# Patient Record
Sex: Female | Born: 1995 | Race: White | Hispanic: No | Marital: Single | Smoking: Never smoker
Health system: Southern US, Community
[De-identification: ages and names within clinical notes are randomized; demographics above are authoritative.]

## PROBLEM LIST (undated history)

## (undated) DIAGNOSIS — M2550 Pain in unspecified joint: Secondary | ICD-10-CM

## (undated) DIAGNOSIS — T7840XA Allergy, unspecified, initial encounter: Secondary | ICD-10-CM

## (undated) DIAGNOSIS — M797 Fibromyalgia: Secondary | ICD-10-CM

## (undated) DIAGNOSIS — F419 Anxiety disorder, unspecified: Secondary | ICD-10-CM

## (undated) DIAGNOSIS — E538 Deficiency of other specified B group vitamins: Secondary | ICD-10-CM

## (undated) DIAGNOSIS — F32A Depression, unspecified: Secondary | ICD-10-CM

## (undated) DIAGNOSIS — K5900 Constipation, unspecified: Secondary | ICD-10-CM

## (undated) DIAGNOSIS — F909 Attention-deficit hyperactivity disorder, unspecified type: Secondary | ICD-10-CM

## (undated) DIAGNOSIS — F329 Major depressive disorder, single episode, unspecified: Secondary | ICD-10-CM

## (undated) DIAGNOSIS — F3181 Bipolar II disorder: Secondary | ICD-10-CM

## (undated) DIAGNOSIS — F4310 Post-traumatic stress disorder, unspecified: Secondary | ICD-10-CM

## (undated) DIAGNOSIS — M549 Dorsalgia, unspecified: Secondary | ICD-10-CM

## (undated) DIAGNOSIS — E559 Vitamin D deficiency, unspecified: Secondary | ICD-10-CM

## (undated) DIAGNOSIS — E282 Polycystic ovarian syndrome: Secondary | ICD-10-CM

## (undated) DIAGNOSIS — R7303 Prediabetes: Secondary | ICD-10-CM

## (undated) DIAGNOSIS — F509 Eating disorder, unspecified: Secondary | ICD-10-CM

## (undated) DIAGNOSIS — R12 Heartburn: Secondary | ICD-10-CM

## (undated) DIAGNOSIS — F429 Obsessive-compulsive disorder, unspecified: Secondary | ICD-10-CM

## (undated) HISTORY — DX: Deficiency of other specified B group vitamins: E53.8

## (undated) HISTORY — DX: Anxiety disorder, unspecified: F41.9

## (undated) HISTORY — DX: Heartburn: R12

## (undated) HISTORY — PX: FRACTURE SURGERY: SHX138

## (undated) HISTORY — DX: Bipolar II disorder: F31.81

## (undated) HISTORY — DX: Obsessive-compulsive disorder, unspecified: F42.9

## (undated) HISTORY — DX: Allergy, unspecified, initial encounter: T78.40XA

## (undated) HISTORY — DX: Fibromyalgia: M79.7

## (undated) HISTORY — DX: Prediabetes: R73.03

## (undated) HISTORY — DX: Eating disorder, unspecified: F50.9

## (undated) HISTORY — DX: Polycystic ovarian syndrome: E28.2

## (undated) HISTORY — DX: Dorsalgia, unspecified: M54.9

## (undated) HISTORY — DX: Vitamin D deficiency, unspecified: E55.9

## (undated) HISTORY — DX: Major depressive disorder, single episode, unspecified: F32.9

## (undated) HISTORY — DX: Pain in unspecified joint: M25.50

## (undated) HISTORY — DX: Attention-deficit hyperactivity disorder, unspecified type: F90.9

## (undated) HISTORY — DX: Constipation, unspecified: K59.00

## (undated) HISTORY — DX: Depression, unspecified: F32.A

## (undated) HISTORY — DX: Post-traumatic stress disorder, unspecified: F43.10

---
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
## 2017-06-28 ENCOUNTER — Telehealth: Payer: Self-pay | Admitting: Certified Nurse Midwife

## 2017-06-28 NOTE — Telephone Encounter (Signed)
Left message to call Janet Palmer at [phone].  

## 2017-06-28 NOTE — Telephone Encounter (Signed)
Patient mother requesting to speak to a nurse about her daughter's irregular periods.

## 2017-06-29 NOTE — Telephone Encounter (Signed)
Spoke with patients mother, "Raynelle Fanning", ok per current dpr. States daughter has only had one cycle since last OV 11/03/16, thinks it was May or June, that was with the provera. Takes 100 mg Lamictal daily, unsure if this may be affecting cycles. Daughter is now away for college, will return next week. Denies pain or any other gyn concerns.   Recommended OV for further evaluation, scheduled for 10/9 at 11am. Advised mother will review with Leota Sauers, CNM and return call with any additional recommendations, mom is agreeable.  Routing to provider for final review.  Will close encounter.

## 2017-06-29 NOTE — Telephone Encounter (Signed)
agree

## 2017-07-06 ENCOUNTER — Encounter: Payer: Self-pay | Admitting: Certified Nurse Midwife

## 2017-07-06 ENCOUNTER — Ambulatory Visit (INDEPENDENT_AMBULATORY_CARE_PROVIDER_SITE_OTHER): Payer: Managed Care, Other (non HMO) | Admitting: Certified Nurse Midwife

## 2017-07-06 VITALS — BP 104/62 | HR 60 | Resp 16 | Ht 63.25 in | Wt 175.0 lb

## 2017-07-06 DIAGNOSIS — Z01419 Encounter for gynecological examination (general) (routine) without abnormal findings: Secondary | ICD-10-CM | POA: Diagnosis not present

## 2017-07-06 DIAGNOSIS — N912 Amenorrhea, unspecified: Secondary | ICD-10-CM

## 2017-07-06 DIAGNOSIS — N926 Irregular menstruation, unspecified: Secondary | ICD-10-CM | POA: Diagnosis not present

## 2017-07-06 NOTE — Progress Notes (Addendum)
Subjective:     Patient ID: Janet Palmer, female   DOB: May 25, 1996, 21 y.o.   MRN: [id number]  Patient complaining of amenorrhea for the past 3 months, possibly last period 6/18 or 7/18. Last year has been same with periods every 2-3 months. One use of Provera and started period after 3 days of use. Feels like period is going to start now with some breast tenderness. Considering OCP for cycle regulation. Sexually active with female only with oral sexually active. No insertion of any device in vagina. History of vagisiimis.Would like to be able to use tampons at some point. Patient has had amenorrhea history off and on for the past two years. Denies any other health issues. On break from [facility], but going back today.     Review of Systems  Constitutional: Negative.   Gastrointestinal: Negative.   Genitourinary: Negative.   Psychiatric/Behavioral: Negative.        Objective:   Physical Exam  Constitutional: She is oriented to person, place, and time. She appears well-developed and well-nourished.  Cardiovascular: Normal rate and regular rhythm.   Pulmonary/Chest: Effort normal.  Breast normal, no masses or nipple discharge  Abdominal: She exhibits no mass. There is no tenderness. There is no guarding.  Genitourinary: Vagina normal and uterus normal. There is no rash, tenderness, lesion or injury on the right labia. There is no rash, tenderness, lesion or injury on the left labia. Cervix exhibits no motion tenderness. Right adnexum displays no mass, no tenderness and no fullness. Left adnexum displays no mass, no tenderness and no fullness.  Genitourinary Comments: Cervical palpation only no visualization due to history of vagisimis.  Tolerated exam well with CMA in attendance. Applied 1 % Lidocaine with Q tip in vaginal opening with good response.  Lymphadenopathy:       Right: No inguinal adenopathy present.       Left: No inguinal adenopathy present.  Neurological: She is alert and  oriented to person, place, and time.  Skin: Skin is warm and dry.  Psychiatric: She has a normal mood and affect. Her behavior is normal. Judgment and thought content normal.        Assessment:     Normal pelvic exam History of Amenorrhea, desires cycle control Not sexually active (oral only) History of vagisimis tolerated exam well Bipolar, Anxiety, OCD on stable medication      Plan:     Discussed finding of normal pelvic exam. Discussed with normal exam, lab evaluation warranted to make sure no pituitary, thyroid or hormone change that is causing the amenorrhea as before. Patient agreeable. If no period by the time labs are in and period starts she could start on OCP to see if this will help with cycle control. Risks and benefits/warning signs, side effects reviewed and expectations with cycle. Patient would like to try. Will advise if Provera challenge used again when bleeding starts so she can start her OCP. Would need 3 month evaluation once she has been on them. Agreeable. No Rx given at this time. Lab: TSH,FSH,Prolactin, HCG Qualitative Discussed progressive treatment option for vagismis available if patient interested. Declines at this time. Aware of STD prevention even with oral sexual activity only  Rv prn and as above.

## 2017-07-06 NOTE — Patient Instructions (Addendum)
Primary Amenorrhea Primary amenorrhea is the absence of any menstrual flow in a female by the age of 15 years. An average age for the start of menstruation is the age of 12 years. Primary amenorrhea is not considered to have occurred until a female is older than 15 years and has never menstruated. This may occur with or without other signs of puberty. What are the causes? Some common causes of not menstruating include:  Chromosomal abnormality causing the ovaries to malfunction is the most common cause of primary amenorrhea.  Malnutrition.  Low blood sugar (hypoglycemia).  Polycystic ovary syndrome (cysts in the ovaries, not ovulating).  Absence of the vagina, uterus, or ovaries since birth (congenital).  Extreme obesity.  Cystic fibrosis.  Drastic weight loss from any cause.  Over-exercising (running, biking) causing loss of body fat.  Pituitary gland tumor in the brain.  Long-term (chronic) illnesses.  Cushing disease.  Thyroid disease (hypothyroidism, hyperthyroidism).  Part of the brain (hypothalamus) not functioning normally.  Premature ovarian failure.  What are the signs or symptoms? No menstruation by age 68 years in normally developed females is the primary symptom. Other symptoms may include:  Discharge from the breasts.  Hot flashes.  Adult acne.  Facial or chest hair.  Headaches.  Impaired vision.  Recent stress.  Changes in weight, diet, or exercise patterns.  How is this diagnosed? Primary amenorrhea is diagnosed with the help of a medical history and a physical exam. Other tests that may be recommended include:  Blood tests to check for pregnancy, hormonal changes, a bleeding or thyroid disorder, low iron levels (anemia), or other problems.  Urine tests.  Specialized X-ray exams.  How is this treated? Treatment will depend on the cause. For example, some of the causes of primary amenorrhea, such as congenital absence of sex organs, will  require surgery to correct. Others may respond to treatment with medicine. Contact a health care provider if:  There has not been any menstrual flow by age 40 years.  Body maturation does not occur at a level typical of peers.  Pelvic area pain occurs.  There is unusual weight gain or hair growth. This information is not intended to replace advice given to you by your health care provider. Make sure you discuss any questions you have with your health care provider. Document Released: 09/14/2005 Document Revised: 02/20/2016 Document Reviewed: 04/26/2013 Elsevier Interactive Patient Education  2018 [employer].  Oral Contraception Use Oral contraceptive pills (OCPs) are medicines taken to prevent pregnancy. OCPs work by preventing the ovaries from releasing eggs. The hormones in OCPs also cause the cervical mucus to thicken, preventing the sperm from entering the uterus. The hormones also cause the uterine lining to become thin, not allowing a fertilized egg to attach to the inside of the uterus. OCPs are highly effective when taken exactly as prescribed. However, OCPs do not prevent sexually transmitted diseases (STDs). Safe sex practices, such as using condoms along with an OCP, can help prevent STDs. Before taking OCPs, you may have a physical exam and Pap test. Your health care provider may also order blood tests if necessary. Your health care provider will make sure you are a good candidate for oral contraception. Discuss with your health care provider the possible side effects of the OCP you may be prescribed. When starting an OCP, it can take 2 to 3 months for the body to adjust to the changes in hormone levels in your body. How to take oral contraceptive pills Your health  care provider may advise you on how to start taking the first cycle of OCPs. Otherwise, you can:  Start on day 1 of your menstrual period. You will not need any backup contraceptive protection with this start  time.  Start on the first Sunday after your menstrual period or the day you get your prescription. In these cases, you will need to use backup contraceptive protection for the first week.  Start the pill at any time of your cycle. If you take the pill within 5 days of the start of your period, you are protected against pregnancy right away. In this case, you will not need a backup form of birth control. If you start at any other time of your menstrual cycle, you will need to use another form of birth control for 7 days. If your OCP is the type called a minipill, it will protect you from pregnancy after taking it for 2 days (48 hours).  After you have started taking OCPs:  If you forget to take 1 pill, take it as soon as you remember. Take the next pill at the regular time.  If you miss 2 or more pills, call your health care provider because different pills have different instructions for missed doses. Use backup birth control until your next menstrual period starts.  If you use a 28-day pack that contains inactive pills and you miss 1 of the last 7 pills (pills with no hormones), it will not matter. Throw away the rest of the non-hormone pills and start a new pill pack.  No matter which day you start the OCP, you will always start a new pack on that same day of the week. Have an extra pack of OCPs and a backup contraceptive method available in case you miss some pills or lose your OCP pack. Follow these instructions at home:  Do not smoke.  Always use a condom to protect against STDs. OCPs do not protect against STDs.  Use a calendar to mark your menstrual period days.  Read the information and directions that came with your OCP. Talk to your health care provider if you have questions. Contact a health care provider if:  You develop nausea and vomiting.  You have abnormal vaginal discharge or bleeding.  You develop a rash.  You miss your menstrual period.  You are losing your  hair.  You need treatment for mood swings or depression.  You get dizzy when taking the OCP.  You develop acne from taking the OCP.  You become pregnant. Get help right away if:  You develop chest pain.  You develop shortness of breath.  You have an uncontrolled or severe headache.  You develop numbness or slurred speech.  You develop visual problems.  You develop pain, redness, and swelling in the legs. This information is not intended to replace advice given to you by your health care provider. Make sure you discuss any questions you have with your health care provider. Document Released: 09/03/2011 Document Revised: 02/20/2016 Document Reviewed: 03/05/2013 Elsevier Interactive Patient Education  2017 [employer].

## 2017-07-07 LAB — FOLLICLE STIMULATING HORMONE: FSH: 1.9 m[IU]/mL

## 2017-07-07 LAB — TSH: TSH: 2.45 u[IU]/mL (ref 0.450–4.500)

## 2017-07-07 LAB — HCG, SERUM, QUALITATIVE: hCG,Beta Subunit,Qual,Serum: NEGATIVE m[IU]/mL (ref <6)

## 2017-07-07 LAB — PROLACTIN: Prolactin: 25.1 ng/mL — ABNORMAL HIGH (ref 4.8–23.3)

## 2017-07-08 ENCOUNTER — Other Ambulatory Visit: Payer: Self-pay | Admitting: Certified Nurse Midwife

## 2017-07-08 ENCOUNTER — Telehealth: Payer: Self-pay

## 2017-07-08 DIAGNOSIS — R899 Unspecified abnormal finding in specimens from other organs, systems and tissues: Secondary | ICD-10-CM

## 2017-07-08 NOTE — Telephone Encounter (Signed)
-----   Message from Verner Chol, CNM sent at 07/08/2017  7:52 AM EDT ----- Notify patient that her TSH and FSH are normal Prolactin slightly elevated from previous screen 8 months ago needs repeat fasting. She can schedule this when she has next visit home, hopefully within a month. She needs to be fasting when done. Order placed We were going to wait at least to end of October to see if menses started. Advise when or if occurs. Would like this repeated prior to starting OCP if possible. Order placed

## 2017-07-08 NOTE — Telephone Encounter (Signed)
lmtcb

## 2017-07-09 NOTE — Telephone Encounter (Signed)
Left message for call back.

## 2017-07-13 ENCOUNTER — Other Ambulatory Visit: Payer: Self-pay

## 2017-07-13 ENCOUNTER — Other Ambulatory Visit: Payer: Self-pay | Admitting: Certified Nurse Midwife

## 2017-07-13 NOTE — Telephone Encounter (Signed)
Patient notified of results. See result note regarding this.

## 2017-07-15 ENCOUNTER — Other Ambulatory Visit: Payer: Self-pay | Admitting: Certified Nurse Midwife

## 2017-07-15 ENCOUNTER — Telehealth: Payer: Self-pay

## 2017-07-15 DIAGNOSIS — N926 Irregular menstruation, unspecified: Secondary | ICD-10-CM

## 2017-07-15 MED ORDER — NORETHIN ACE-ETH ESTRAD-FE 1-20 MG-MCG(24) PO TABS: 1.0000 | ORAL_TABLET | Freq: Every day | ORAL | 0 refills | Status: DC

## 2017-07-15 NOTE — Telephone Encounter (Signed)
Patient aware order will be placed for OCP

## 2019-12-20 ENCOUNTER — Encounter: Payer: Self-pay | Admitting: Certified Nurse Midwife

## 2019-12-22 ENCOUNTER — Ambulatory Visit: Payer: Self-pay | Attending: Internal Medicine

## 2019-12-22 DIAGNOSIS — Z23 Encounter for immunization: Secondary | ICD-10-CM

## 2019-12-22 NOTE — Progress Notes (Signed)
   Covid-19 Vaccination Clinic  Name:  Altovise Wahler    MRN: [id number] DOB: 02/21/1996  12/22/2019  Ms. Hornaday was observed post Covid-19 immunization for 15 minutes without incident. She was provided with Vaccine Information Sheet and instruction to access the V-Safe system.   Ms. Arthurs was instructed to call 911 with any severe reactions post vaccine: Marland Kitchen Difficulty breathing  . Swelling of face and throat  . A fast heartbeat  . A bad rash all over body  . Dizziness and weakness   Immunizations Administered    Name Date Dose VIS Date Route   Pfizer COVID-19 Vaccine 12/22/2019  3:32 PM 0.3 mL 09/08/2019 Intramuscular   Manufacturer: [employer], [employer]   Lot: [id number]   NDC: [address]-3

## 2020-01-16 ENCOUNTER — Ambulatory Visit: Payer: Self-pay | Attending: Internal Medicine

## 2020-01-16 DIAGNOSIS — Z23 Encounter for immunization: Secondary | ICD-10-CM

## 2020-01-16 NOTE — Progress Notes (Signed)
   Covid-19 Vaccination Clinic  Name:  Janet Palmer    MRN: [id number] DOB: Feb 22, 1996  01/16/2020  [name] was observed post Covid-19 immunization for 15 minutes without incident. She was provided with Vaccine Information Sheet and instruction to access the V-Safe system.   [name] was instructed to call 911 with any severe reactions post vaccine: Marland Kitchen Difficulty breathing  . Swelling of face and throat  . A fast heartbeat  . A bad rash all over body  . Dizziness and weakness   Immunizations Administered    Name Date Dose VIS Date Route   Pfizer COVID-19 Vaccine 01/16/2020  4:55 PM 0.3 mL 11/22/2018 Intramuscular   Manufacturer: [employer], [employer]   Lot: [id number]   NDC: [address]-7

## 2022-11-20 DIAGNOSIS — F900 Attention-deficit hyperactivity disorder, predominantly inattentive type: Secondary | ICD-10-CM | POA: Insufficient documentation

## 2022-11-20 DIAGNOSIS — Z79899 Other long term (current) drug therapy: Secondary | ICD-10-CM | POA: Diagnosis not present

## 2022-11-20 DIAGNOSIS — F429 Obsessive-compulsive disorder, unspecified: Secondary | ICD-10-CM | POA: Diagnosis not present

## 2022-11-20 DIAGNOSIS — F411 Generalized anxiety disorder: Secondary | ICD-10-CM | POA: Diagnosis not present

## 2022-11-20 DIAGNOSIS — F909 Attention-deficit hyperactivity disorder, unspecified type: Secondary | ICD-10-CM | POA: Insufficient documentation

## 2022-11-20 DIAGNOSIS — F3181 Bipolar II disorder: Secondary | ICD-10-CM | POA: Diagnosis not present

## 2023-01-22 ENCOUNTER — Ambulatory Visit (HOSPITAL_COMMUNITY)
Admission: RE | Admit: 2023-01-22 | Discharge: 2023-01-22 | Disposition: A | Discharge status: Home / Self Care | Payer: BC Managed Care – PPO | Source: Ambulatory Visit

## 2023-01-22 ENCOUNTER — Encounter (HOSPITAL_COMMUNITY): Payer: Self-pay

## 2023-01-22 VITALS — BP 153/96 | HR 95 | Temp 98.0°F | Resp 18

## 2023-01-22 DIAGNOSIS — H6123 Impacted cerumen, bilateral: Secondary | ICD-10-CM | POA: Diagnosis not present

## 2023-01-22 NOTE — ED Triage Notes (Signed)
Bilateral ear fullness x 1 week. Denies any other symptoms.

## 2023-01-22 NOTE — Discharge Instructions (Signed)
We were able to remove the ear wax from both ears today  Continue doing a great job not using Q-tips.  You can use OTC Debrox ear drops to help loosen ear wax as needed.

## 2023-01-22 NOTE — ED Provider Notes (Signed)
[facility]    CSN: [id number] Arrival date & time: 01/22/23  1843      History   Chief Complaint Chief Complaint  Patient presents with   Ear Fullness    Both my poor ears are blocked. Please help so I can hear again. <3 - Entered by patient    HPI Janet Palmer is a 27 y.o. adult.   Patient presents today with bilateral ear fullness for the past week, no pain.  They endorse decreased hearing out of the left ear.  Reports history of ceruminosis.  No fever, ear drainage, cough, congestion, or sore throat.  Has not tried thing for symptoms so far.  Has had ears rinsed before.    Past Medical History:  Diagnosis Date   Anxiety    Bipolar 2 disorder (HCC)    Depression    OCD (obsessive compulsive disorder)     Patient Active Problem List   Diagnosis Date Noted   Attention deficit hyperactivity disorder, inattentive type 11/20/2022   Bipolar 2 disorder (HCC)    OCD (obsessive compulsive disorder)    Anxiety state, unspecified 02/15/2014    Past Surgical History:  Procedure Laterality Date   arm fracture Left 2006    OB History     Gravida  0   Para  0   Term  0   Preterm  0   AB  0   Living  0      SAB  0   IAB  0   Ectopic  0   Multiple  0   Live Births  0            Home Medications    Prior to Admission medications   Medication Sig Start Date End Date Taking? Authorizing Provider  ALPRAZolam Prudy Feeler) 0.5 MG tablet Take 0.5 mg by mouth as needed for anxiety.   Yes [provider]  ARIPiprazole (ABILIFY) 5 MG tablet Take by mouth. 08/04/22  Yes [provider]  hydrOXYzine (ATARAX) 10 MG tablet Take by mouth. 12/11/21  Yes [provider]  Norethindrone Acetate-Ethinyl Estrad-FE (LOESTRIN 24 FE) 1-20 MG-MCG(24) tablet Take 1 tablet by mouth daily. 07/15/17  Yes Verner Chol, CNM  lamoTRIgine (LAMICTAL) 100 MG tablet Take 100 mg by mouth daily.    [provider]    Family  History Family History  Problem Relation Age of Onset   Depression Mother    Stroke Father    Diabetes Maternal Grandmother    Cancer Maternal Grandmother        lung with METS   Depression Maternal Grandmother    Cancer Maternal Grandfather        pancreatic   Hypertension Maternal Grandfather     Social History Social History   Tobacco Use   Smoking status: Never   Smokeless tobacco: Never  Substance Use Topics   Alcohol use: No   Drug use: No     Allergies   Amoxicillin and Motrin [ibuprofen]   Review of Systems Review of Systems Per HPI  Physical Exam Triage Vital Signs ED Triage Vitals [01/22/23 1900]  Enc Vitals Group     BP (!) 153/96     Pulse Rate 95     Resp 18     Temp 98 F (36.7 C)     Temp Source Oral     SpO2 97 %     Weight      Height      Head  Circumference      Peak Flow      Pain Score      Pain Loc      Pain Edu?      Excl. in GC?    No data found.  Updated Vital Signs BP (!) 153/96 (BP Location: Left Arm)   Pulse 95   Temp 98 F (36.7 C) (Oral)   Resp 18   SpO2 97%   Visual Acuity Right Eye Distance:   Left Eye Distance:   Bilateral Distance:    Right Eye Near:   Left Eye Near:    Bilateral Near:     Physical Exam Vitals and nursing note reviewed.  Constitutional:      General: Janet Duerst "MJ" is not in acute distress.    Appearance: Normal appearance. Janet Steedman "MJ" is not toxic-appearing.  HENT:     Head: Normocephalic and atraumatic.     Right Ear: There is impacted cerumen.     Left Ear: There is impacted cerumen.     Mouth/Throat:     Mouth: Mucous membranes are moist.     Pharynx: Oropharynx is clear.  Pulmonary:     Effort: Pulmonary effort is normal. No respiratory distress.  Skin:    General: Skin is warm and dry.     Capillary Refill: Capillary refill takes less than 2 seconds.     Coloration: Skin is not jaundiced or pale.     Findings: No erythema.  Neurological:     Mental Status:  Janet Navedo "MJ" is alert and oriented to person, place, and time.  Psychiatric:        Behavior: Behavior is cooperative.      UC Treatments / Results  Labs (all labs ordered are listed, but only abnormal results are displayed) Labs Reviewed - No data to display  EKG   Radiology No results found.  Procedures Ear Cerumen Removal  Date/Time: 01/22/2023 8:32 PM  Performed by: Valentino Nose, NP Authorized by: Valentino Nose, NP   Consent:    Consent obtained:  Verbal   Consent given by:  Patient   Risks, benefits, and alternatives were discussed: yes     Risks discussed:  Bleeding, pain, TM perforation and dizziness   Alternatives discussed:  Delayed treatment Universal protocol:    Procedure explained and questions answered to patient or proxy's satisfaction: yes     Patient identity confirmed:  Verbally with patient Procedure details:    Location:  L ear and R ear   Procedure type: irrigation     Procedure outcomes: cerumen removed   Post-procedure details:    Hearing quality:  Improved   Procedure completion:  Tolerated well, no immediate complications  (including critical care time)  Medications Ordered in UC Medications - No data to display  Initial Impression / Assessment and Plan / UC Course  I have reviewed the triage vital signs and the nursing notes.  Pertinent labs & imaging results that were available during my care of the patient were reviewed by me and considered in my medical decision making (see chart for details).   Patient is well-appearing, afebrile, not tachycardic, not tachypneic, oxygenating well on room air.  Patient is mildly hypertensive today in urgent care.  1. Ceruminosis, bilateral Ear lavage was provided to both ears with full removal of earwax Tympanic membrane's are intact bilaterally after removal Patient tolerated well Recommended use of over-the-counter Debrox to help loosen earwax  The patient was given the  opportunity to ask questions.  All questions answered to their satisfaction.  The patient is in agreement to this plan.    Final Clinical Impressions(s) / UC Diagnoses   Final diagnoses:  Ceruminosis, bilateral     Discharge Instructions      We were able to remove the ear wax from both ears today  Continue doing a great job not using Q-tips.  You can use OTC Debrox ear drops to help loosen ear wax as needed.      ED Prescriptions   None    PDMP not reviewed this encounter.   Valentino Nose, NP 01/22/23 2032

## 2023-01-25 DIAGNOSIS — F3181 Bipolar II disorder: Secondary | ICD-10-CM | POA: Diagnosis not present

## 2023-02-19 DIAGNOSIS — F3181 Bipolar II disorder: Secondary | ICD-10-CM | POA: Diagnosis not present

## 2023-02-19 DIAGNOSIS — F411 Generalized anxiety disorder: Secondary | ICD-10-CM | POA: Diagnosis not present

## 2023-03-09 DIAGNOSIS — F3181 Bipolar II disorder: Secondary | ICD-10-CM | POA: Diagnosis not present

## 2023-03-24 DIAGNOSIS — G894 Chronic pain syndrome: Secondary | ICD-10-CM | POA: Diagnosis not present

## 2023-04-06 DIAGNOSIS — F3181 Bipolar II disorder: Secondary | ICD-10-CM | POA: Diagnosis not present

## 2023-04-13 DIAGNOSIS — Z79899 Other long term (current) drug therapy: Secondary | ICD-10-CM | POA: Diagnosis not present

## 2023-04-13 DIAGNOSIS — G894 Chronic pain syndrome: Secondary | ICD-10-CM | POA: Diagnosis not present

## 2023-04-13 DIAGNOSIS — E282 Polycystic ovarian syndrome: Secondary | ICD-10-CM | POA: Diagnosis not present

## 2023-04-20 DIAGNOSIS — F3181 Bipolar II disorder: Secondary | ICD-10-CM | POA: Diagnosis not present

## 2023-05-04 DIAGNOSIS — F3181 Bipolar II disorder: Secondary | ICD-10-CM | POA: Diagnosis not present

## 2023-05-13 DIAGNOSIS — N939 Abnormal uterine and vaginal bleeding, unspecified: Secondary | ICD-10-CM | POA: Diagnosis not present

## 2023-05-14 DIAGNOSIS — F411 Generalized anxiety disorder: Secondary | ICD-10-CM | POA: Diagnosis not present

## 2023-05-14 DIAGNOSIS — F900 Attention-deficit hyperactivity disorder, predominantly inattentive type: Secondary | ICD-10-CM | POA: Diagnosis not present

## 2023-05-14 DIAGNOSIS — F3181 Bipolar II disorder: Secondary | ICD-10-CM | POA: Diagnosis not present

## 2023-05-18 DIAGNOSIS — F3181 Bipolar II disorder: Secondary | ICD-10-CM | POA: Diagnosis not present

## 2023-05-18 DIAGNOSIS — H6122 Impacted cerumen, left ear: Secondary | ICD-10-CM | POA: Diagnosis not present

## 2023-05-26 DIAGNOSIS — N926 Irregular menstruation, unspecified: Secondary | ICD-10-CM | POA: Diagnosis not present

## 2023-05-26 DIAGNOSIS — Z8742 Personal history of other diseases of the female genital tract: Secondary | ICD-10-CM | POA: Diagnosis not present

## 2023-05-26 DIAGNOSIS — E282 Polycystic ovarian syndrome: Secondary | ICD-10-CM | POA: Diagnosis not present

## 2023-06-01 DIAGNOSIS — F3181 Bipolar II disorder: Secondary | ICD-10-CM | POA: Diagnosis not present

## 2023-06-02 ENCOUNTER — Ambulatory Visit (INDEPENDENT_AMBULATORY_CARE_PROVIDER_SITE_OTHER): Payer: BC Managed Care – PPO | Admitting: Family Medicine

## 2023-06-02 ENCOUNTER — Encounter: Payer: Self-pay | Admitting: Family Medicine

## 2023-06-02 VITALS — BP 104/76 | HR 88 | Temp 97.6°F | Ht 63.5 in | Wt 251.0 lb

## 2023-06-02 DIAGNOSIS — R7303 Prediabetes: Secondary | ICD-10-CM | POA: Insufficient documentation

## 2023-06-02 DIAGNOSIS — Z6841 Body Mass Index (BMI) 40.0 and over, adult: Secondary | ICD-10-CM | POA: Diagnosis not present

## 2023-06-02 DIAGNOSIS — F3181 Bipolar II disorder: Secondary | ICD-10-CM | POA: Diagnosis not present

## 2023-06-02 DIAGNOSIS — F900 Attention-deficit hyperactivity disorder, predominantly inattentive type: Secondary | ICD-10-CM

## 2023-06-02 DIAGNOSIS — F429 Obsessive-compulsive disorder, unspecified: Secondary | ICD-10-CM

## 2023-06-02 DIAGNOSIS — G894 Chronic pain syndrome: Secondary | ICD-10-CM | POA: Diagnosis not present

## 2023-06-02 DIAGNOSIS — E282 Polycystic ovarian syndrome: Secondary | ICD-10-CM | POA: Diagnosis not present

## 2023-06-02 NOTE — Progress Notes (Signed)
New Patient Office Visit  Subjective    Patient ID: Janet Palmer, adult    DOB: [birth year]  Age: 27 y.o. MRN: [id number]  CC:  Chief Complaint  Patient presents with   Establish Care    Would like to discuss weight gain, was on metformin due to higher A1c and pcos, wants to discuss getting back on it    HPI Janet Palmer presents to establish care Previous PCP- [facility]   Other providers: Pain management- Jaynie Crumble, PA Behavorial Health - Barbette Merino NP Therapist- [facility]- [facility]    No thoughts of SI   Metformin for 2 years. Stopped it last year and would like to consider restarting.   Reports hx of  Levonne Spiller last year and thought to be related to Lamictal.   Concerned about weight and weight gain.   Reports hx of eating disorder   Pain in multiple joints. History of negative inflammatory testing.   States they have tried Cymbalta, gabapentin, and others. States pain management is considering starting amitriptyline   Referral placed to endocrinologist by pain management and appt in December.   Outpatient Encounter Medications as of 06/02/2023  Medication Sig   ARIPiprazole (ABILIFY) 5 MG tablet Take by mouth.   guanFACINE (INTUNIV) 1 MG TB24 ER tablet Take by mouth.   hydrOXYzine (ATARAX) 10 MG tablet Take by mouth.   medroxyPROGESTERone (PROVERA) 10 MG tablet If no period > 90 days, take one tablet daily x 10 days   STRATTERA 25 MG capsule    diazepam (VALIUM) 5 MG tablet Take 1 tablet 30 minutes prior to procedure. Bring second tablet to appointment. Do not drive after taking medication (Patient not taking: Reported on 06/02/2023)   [DISCONTINUED] ALPRAZolam (XANAX) 0.5 MG tablet Take 0.5 mg by mouth as needed for anxiety.   [DISCONTINUED] lamoTRIgine (LAMICTAL) 100 MG tablet Take 100 mg by mouth daily.   [DISCONTINUED] Norethindrone Acetate-Ethinyl Estrad-FE (LOESTRIN 24 FE) 1-20 MG-MCG(24) tablet Take 1 tablet by  mouth daily.   No facility-administered encounter medications on file as of 06/02/2023.    Past Medical History:  Diagnosis Date   Allergy    Anxiety    Bipolar 2 disorder (HCC)    Depression    OCD (obsessive compulsive disorder)     Past Surgical History:  Procedure Laterality Date   arm fracture Left 2006   FRACTURE SURGERY      Family History  Problem Relation Age of Onset   Depression Mother    Alcohol abuse Mother    Anxiety disorder Mother    Stroke Father    Diabetes Maternal Grandmother    Cancer Maternal Grandmother        lung with METS   Depression Maternal Grandmother    Cancer Maternal Grandfather        pancreatic   Hypertension Maternal Grandfather     Social History   Socioeconomic History   Marital status: Single    Spouse name: Not on file   Number of children: Not on file   Years of education: Not on file   Highest education level: Not on file  Occupational History   Not on file  Tobacco Use   Smoking status: Never   Smokeless tobacco: Never  Substance and Sexual Activity   Alcohol use: Not Currently   Drug use: Never   Sexual activity: Not Currently    Partners: Female, Female    Birth control/protection: Other-see comments  Comment: Lack of penetrative sex  Other Topics Concern   Not on file  Social History Narrative   Not on file   Social Determinants of Health   Financial Resource Strain: Not on file  Food Insecurity: Medium Risk (03/23/2023)   Received from [facility]   Hunger Vital Sign    Worried About Running Out of Food in the Last Year: Sometimes true    Ran Out of Food in the Last Year: Never true  Transportation Needs: Not on file (03/23/2023)  Physical Activity: Not on file  Stress: Not on file  Social Connections: Not on file  Intimate Partner Violence: Not on file    Review of Systems  Constitutional:  Negative for chills and fever.  Respiratory:  Negative for shortness of breath.   Cardiovascular:   Negative for chest pain, palpitations and leg swelling.  Gastrointestinal:  Negative for abdominal pain, constipation, diarrhea, nausea and vomiting.  Musculoskeletal:  Positive for joint pain and myalgias.  Neurological:  Negative for dizziness and focal weakness.  Psychiatric/Behavioral:  Positive for depression. Negative for substance abuse and suicidal ideas. The patient is nervous/anxious.         Objective    BP 104/76 (BP Location: Left Arm, Patient Position: Sitting, Cuff Size: Large)   Pulse 88   Temp 97.6 F (36.4 C) (Temporal)   Ht 5' 3.5" (1.613 m)   Wt 251 lb (113.9 kg)   SpO2 97%   BMI 43.77 kg/m   Physical Exam Constitutional:      General: MJ is not in acute distress.    Appearance: MJ is not ill-appearing.  Eyes:     Extraocular Movements: Extraocular movements intact.     Conjunctiva/sclera: Conjunctivae normal.  Cardiovascular:     Rate and Rhythm: Normal rate and regular rhythm.  Pulmonary:     Effort: Pulmonary effort is normal.     Breath sounds: Normal breath sounds.  Musculoskeletal:     Cervical back: Normal range of motion and neck supple.  Skin:    General: Skin is warm and dry.  Neurological:     General: No focal deficit present.     Mental Status: MJ is alert and oriented to person, place, and time.  Psychiatric:        Mood and Affect: Mood normal.        Behavior: Behavior normal.        Thought Content: Thought content normal.         Assessment & Plan:   Problem List Items Addressed This Visit       Endocrine   Polycystic ovarian syndrome     Other   Attention deficit hyperactivity disorder, inattentive type   Bipolar 2 disorder (HCC)   Chronic pain   OCD (obsessive compulsive disorder)   Prediabetes - Primary   Severe obesity (BMI >= 40) (HCC)   Relevant Medications   guanFACINE (INTUNIV) 1 MG TB24 ER tablet   STRATTERA 25 MG capsule   Other Relevant Orders   Amb Ref to Medical Weight Management   Here to  establish care. Previous PCP with [facility]. They have several specialists with [facility] including OB/GYN, pain management, psychiatrist and upcoming appt with endocrinology. We discussed restarting metformin but will hold off for now.  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  Referral to [facility]. Reviewed recent labs and no labs ordered today.   Return if symptoms worsen or fail to improve.   Anilah Huck  Suezanne Jacquet, NP-C

## 2023-06-15 DIAGNOSIS — E282 Polycystic ovarian syndrome: Secondary | ICD-10-CM | POA: Diagnosis not present

## 2023-06-15 DIAGNOSIS — G894 Chronic pain syndrome: Secondary | ICD-10-CM | POA: Diagnosis not present

## 2023-06-15 DIAGNOSIS — Z79899 Other long term (current) drug therapy: Secondary | ICD-10-CM | POA: Diagnosis not present

## 2023-06-15 DIAGNOSIS — F3181 Bipolar II disorder: Secondary | ICD-10-CM | POA: Diagnosis not present

## 2023-06-18 DIAGNOSIS — F411 Generalized anxiety disorder: Secondary | ICD-10-CM | POA: Diagnosis not present

## 2023-06-18 DIAGNOSIS — F3181 Bipolar II disorder: Secondary | ICD-10-CM | POA: Diagnosis not present

## 2023-06-18 DIAGNOSIS — F900 Attention-deficit hyperactivity disorder, predominantly inattentive type: Secondary | ICD-10-CM | POA: Diagnosis not present

## 2023-06-29 DIAGNOSIS — F3181 Bipolar II disorder: Secondary | ICD-10-CM | POA: Diagnosis not present

## 2023-07-22 ENCOUNTER — Ambulatory Visit (INDEPENDENT_AMBULATORY_CARE_PROVIDER_SITE_OTHER): Payer: BC Managed Care – PPO | Admitting: Family Medicine

## 2023-07-22 ENCOUNTER — Encounter: Payer: Self-pay | Admitting: Family Medicine

## 2023-07-22 VITALS — BP 130/84 | HR 88 | Temp 97.6°F | Ht 63.5 in | Wt 250.0 lb

## 2023-07-22 DIAGNOSIS — R7303 Prediabetes: Secondary | ICD-10-CM

## 2023-07-22 MED ORDER — METFORMIN HCL ER 500 MG PO TB24: 500.0000 mg | ORAL_TABLET | Freq: Every day | ORAL | 2 refills | Status: DC

## 2023-07-22 NOTE — Patient Instructions (Signed)
Let me know if you have any issues with the metformin.

## 2023-07-22 NOTE — Progress Notes (Signed)
Subjective:     Patient ID: Janet Palmer, adult    DOB: 01/17/1996, 27 y.o.   MRN: [id number]  Chief Complaint  Patient presents with   Weight Gain    Would like to discuss metformin     HPI   History of Present Illness         Here to request restarting metformin to help with weight.   She has been referred to [facility] and will go to her information session next week.   She has seen her pain management specialist and behavorial health specialist since our last visit.   Other providers: Pain management- Jaynie Crumble, PA Behavorial Health - Barbette Merino NP Therapist- [facility]- [facility]   Health Maintenance Due  Topic Date Due   HPV VACCINES (2 - 2-dose series) 03/28/2012   HIV Screening  Never done   Hepatitis C Screening  Never done   Cervical Cancer Screening (Pap smear)  Never done   DTaP/Tdap/Td (2 - Td or Tdap) 09/28/2020    Past Medical History:  Diagnosis Date   Allergy    Anxiety    Bipolar 2 disorder (HCC)    Depression    OCD (obsessive compulsive disorder)     Past Surgical History:  Procedure Laterality Date   arm fracture Left 2006   FRACTURE SURGERY      Family History  Problem Relation Age of Onset   Depression Mother    Alcohol abuse Mother    Anxiety disorder Mother    Stroke Father    Diabetes Maternal Grandmother    Cancer Maternal Grandmother        lung with METS   Depression Maternal Grandmother    Cancer Maternal Grandfather        pancreatic   Hypertension Maternal Grandfather     Social History   Socioeconomic History   Marital status: Single    Spouse name: Not on file   Number of children: Not on file   Years of education: Not on file   Highest education level: Bachelor's degree (e.g., BA, AB, BS)  Occupational History   Not on file  Tobacco Use   Smoking status: Never   Smokeless tobacco: Never  Substance and Sexual Activity   Alcohol use: Not Currently   Drug use: Never   Sexual  activity: Not Currently    Partners: Female, Female    Birth control/protection: Other-see comments    Comment: Lack of penetrative sex  Other Topics Concern   Not on file  Social History Narrative   Not on file   Social Determinants of Health   Financial Resource Strain: Medium Risk (07/17/2023)   Overall Financial Resource Strain (CARDIA)    Difficulty of Paying Living Expenses: Somewhat hard  Food Insecurity: No Food Insecurity (07/17/2023)   Hunger Vital Sign    Worried About Running Out of Food in the Last Year: Never true    Ran Out of Food in the Last Year: Never true  Transportation Needs: No Transportation Needs (07/17/2023)   PRAPARE - Administrator, Civil Service (Medical): No    Lack of Transportation (Non-Medical): No  Physical Activity: Insufficiently Active (07/17/2023)   Exercise Vital Sign    Days of Exercise per Week: 3 days    Minutes of Exercise per Session: 20 min  Stress: Stress Concern Present (07/17/2023)   [employer] of Occupational Health - Occupational Stress Questionnaire    Feeling of Stress : Very much  Social Connections: Socially Isolated (07/17/2023)   Social Connection and Isolation Panel [NHANES]    Frequency of Communication with Friends and Family: Twice a week    Frequency of Social Gatherings with Friends and Family: Twice a week    Attends Religious Services: Never    Database administrator or Organizations: No    Attends Engineer, structural: Not on file    Marital Status: Never married  Intimate Partner Violence: Not on file    Outpatient Medications Prior to Visit  Medication Sig Dispense Refill   ARIPiprazole (ABILIFY) 5 MG tablet Take by mouth.     diazepam (VALIUM) 5 MG tablet      guanFACINE (INTUNIV) 1 MG TB24 ER tablet Take by mouth.     hydrOXYzine (ATARAX) 10 MG tablet Take by mouth.     medroxyPROGESTERone (PROVERA) 10 MG tablet If no period > 90 days, take one tablet daily x 10 days      STRATTERA 25 MG capsule      nortriptyline (PAMELOR) 10 MG capsule TAKE 1 CAPSULE BY MOUTH AT BEDTIME. **NEED PRIMARY INS ** (Patient not taking: Reported on 07/22/2023)     No facility-administered medications prior to visit.    Allergies  Allergen Reactions   Amoxicillin    Motrin [Ibuprofen]    Other Other (See Comments)    Birth Control causes suicidal tendencies   Soy Allergy Nausea And Vomiting    Other Reaction(s): GI Intolerance    Review of Systems  Constitutional:  Negative for chills and fever.  Respiratory:  Negative for shortness of breath.   Cardiovascular:  Negative for chest pain, palpitations and leg swelling.  Gastrointestinal:  Negative for abdominal pain, constipation, diarrhea, nausea and vomiting.  Neurological:  Negative for dizziness and focal weakness.       Objective:    Physical Exam Constitutional:      General: Janet Palmer is not in acute distress.    Appearance: Janet Palmer is not ill-appearing.  Eyes:     Extraocular Movements: Extraocular movements intact.     Conjunctiva/sclera: Conjunctivae normal.  Cardiovascular:     Rate and Rhythm: Normal rate.  Pulmonary:     Effort: Pulmonary effort is normal.  Musculoskeletal:     Cervical back: Normal range of motion.  Skin:    General: Skin is dry.  Neurological:     General: No focal deficit present.     Mental Status: Janet Palmer is alert and oriented to person, place, and time.  Psychiatric:        Mood and Affect: Mood normal.        Behavior: Behavior normal.        Thought Content: Thought content normal.      BP 130/84 (BP Location: Left Arm, Patient Position: Sitting, Cuff Size: Large)   Pulse 88   Temp 97.6 F (36.4 C) (Temporal)   Ht 5' 3.5" (1.613 m)   Wt 250 lb (113.4 kg)   SpO2 96%   BMI 43.59 kg/m  Wt Readings from Last 3 Encounters:  07/22/23 250 lb (113.4 kg)  06/02/23 251 lb (113.9 kg)  07/06/17 175 lb (79.4 kg) (94%, Z= 1.54)*   * Growth percentiles are based on CDC (Girls, 2-20  Years) data.       Assessment & Plan:   Problem List Items Addressed This Visit       Other   Prediabetes - Primary   Relevant Medications   metFORMIN (GLUCOPHAGE-XR) 500 MG 24 hr  tablet   Severe obesity (BMI >= 40) (HCC)   Relevant Medications   metFORMIN (GLUCOPHAGE-XR) 500 MG 24 hr tablet   Restart metformin. Continue with healthy diet and exercise.  She will have her information session with [facility] next week. Appt with endocrinologist in December. Follow up 3 months    I am having Janet Palmer "Janet Palmer" start on metFORMIN. I am also having Janet Palmer maintain Janet Palmer's ARIPiprazole, hydrOXYzine, guanFACINE, Strattera, diazepam, medroxyPROGESTERone, and nortriptyline.  Meds ordered this encounter  Medications   metFORMIN (GLUCOPHAGE-XR) 500 MG 24 hr tablet    Sig: Take 1 tablet (500 mg total) by mouth daily with breakfast.    Dispense:  30 tablet    Refill:  2    Order Specific Question:   Supervising Provider    Answer:   Hillard Danker A [[id number]]

## 2023-07-27 ENCOUNTER — Encounter (INDEPENDENT_AMBULATORY_CARE_PROVIDER_SITE_OTHER): Payer: Self-pay | Admitting: Adult Health

## 2023-07-27 ENCOUNTER — Ambulatory Visit (INDEPENDENT_AMBULATORY_CARE_PROVIDER_SITE_OTHER): Payer: BC Managed Care – PPO | Admitting: Adult Health

## 2023-07-27 VITALS — BP 119/85 | HR 88 | Temp 97.6°F | Ht 63.0 in | Wt 242.0 lb

## 2023-07-27 DIAGNOSIS — Z0289 Encounter for other administrative examinations: Secondary | ICD-10-CM

## 2023-07-27 DIAGNOSIS — R7303 Prediabetes: Secondary | ICD-10-CM

## 2023-07-27 DIAGNOSIS — F3181 Bipolar II disorder: Secondary | ICD-10-CM | POA: Diagnosis not present

## 2023-07-27 DIAGNOSIS — Z6841 Body Mass Index (BMI) 40.0 and over, adult: Secondary | ICD-10-CM

## 2023-07-27 DIAGNOSIS — E559 Vitamin D deficiency, unspecified: Secondary | ICD-10-CM | POA: Diagnosis not present

## 2023-07-27 NOTE — Progress Notes (Signed)
Office: [phone]  /  Fax: [phone]   Initial Visit  Emmersyn Anctil was seen in clinic today to evaluate for obesity. MJ is interested in losing weight to improve overall health and reduce the risk of weight related complications. MJ presents today to review program treatment options, initial physical assessment, and evaluation.     MJ was referred by: PCP  When asked what else they would like to accomplish? MJ states: Adopt healthier eating patterns, Improve energy levels and physical activity, Improve existing medical conditions, Reduce number of medications, and Improve quality of life  Weight history: She has been calorie counting since age 27  When asked how has your weight affected you? MJ states: Contributed to medical problems, Contributed to orthopedic problems or mobility issues, Having fatigue, and Having poor endurance  Some associated conditions: Prediabetes, PCOS, and Vitamin D Deficiency  Contributing factors: Family history, Nutritional, Medications, Stress, Reduced physical activity, and Eating patterns  Weight promoting medications identified: Psychotropic medications  Current nutrition plan: None  Current level of physical activity: Strength training  Current or previous pharmacotherapy: Metformin  Response to medication: Lost weight initially but was unable to sustain weight loss   Past medical history includes:   Past Medical History:  Diagnosis Date   Allergy    Anxiety    Bipolar 2 disorder (HCC)    Depression    OCD (obsessive compulsive disorder)      Objective:   BP 119/85   Pulse 88   Temp 97.6 F (36.4 C)   Ht 5\' 3"  (1.6 m)   Wt 242 lb (109.8 kg)   LMP 06/29/2023   SpO2 98%   BMI 42.87 kg/m  MJ was weighed on the bioimpedance scale: Body mass index is 42.87 kg/m.  Peak Weight:230 , Body Fat%:49.9, Visceral Fat Rating:13, Weight trend over the last 12 months: Increasing  General:  Alert, oriented and cooperative. Patient is in  no acute distress.  Respiratory: Normal respiratory effort, no problems with respiration noted   Gait: able to ambulate independently  Mental Status: Normal mood and affect. Normal behavior. Normal judgment and thought content.   DIAGNOSTIC DATA REVIEWED:  BMET    Component Value Date/Time   NA 138 11/29/2014 0832   K 4.6 11/29/2014 0832   CL 104 11/29/2014 0832   CO2 26 11/29/2014 0832   GLUCOSE 85 11/29/2014 0832   BUN 7 11/29/2014 0832   CREATININE 0.59 11/29/2014 0832   CALCIUM 9.5 11/29/2014 0832   GFRNONAA >89 11/29/2014 0832   GFRAA >89 11/29/2014 0832   Lab Results  Component Value Date   HGBA1C 5.6 11/29/2014   No results found for: "INSULIN" CBC    Component Value Date/Time   WBC 5.5 11/29/2014 0832   RBC 4.22 11/29/2014 0832   HGB 12.7 11/29/2014 0832   HCT 38.3 11/29/2014 0832   PLT 337 11/29/2014 0832   MCV 90.8 11/29/2014 0832   MCH 30.1 11/29/2014 0832   MCHC 33.2 11/29/2014 0832   RDW 13.2 11/29/2014 0832   Iron/TIBC/Ferritin/ %Sat No results found for: "IRON", "TIBC", "FERRITIN", "IRONPCTSAT" Lipid Panel     Component Value Date/Time   CHOL 137 11/29/2014 0832   TRIG 45 11/29/2014 0832   HDL 52 11/29/2014 0832   CHOLHDL 2.6 11/29/2014 0832   VLDL 9 11/29/2014 0832   LDLCALC 76 11/29/2014 0832   Hepatic Function Panel     Component Value Date/Time   PROT 7.1 11/29/2014 0832   ALBUMIN 4.4 11/29/2014 0832  AST 16 11/29/2014 0832   ALT <8 11/29/2014 0832   ALKPHOS 54 11/29/2014 0832   BILITOT 0.5 11/29/2014 0832   BILIDIR 0.1 11/29/2014 0832   IBILI 0.4 11/29/2014 0832      Component Value Date/Time   TSH 2.450 07/06/2017 1449     Assessment and Plan:   Vitamin D deficiency  Prediabetes  Morbid obesity (HCC), Starting BMI 42.88  ESTABLISH AT [facility]   Obesity Treatment / Action Plan:  Patient will work on garnering support from family and friends to begin weight loss journey. Will work on eliminating or reducing the presence  of highly palatable, calorie dense foods in the home. Will complete provided nutritional and psychosocial assessment questionnaire before the next appointment. Will be scheduled for indirect calorimetry to determine resting energy expenditure in a fasting state.  This will allow [place] to create a reduced calorie, high-protein meal plan to promote loss of fat mass while preserving muscle mass. Counseled on the health benefits of losing 5%-15% of total body weight. Was counseled on nutritional approaches to weight loss and benefits of reducing processed foods and consuming plant-based foods and high quality protein as part of nutritional weight management. Was counseled on pharmacotherapy and role as an adjunct in weight management.   Obesity Education Performed Today:  MJ was weighed on the bioimpedance scale and results were discussed and documented in the synopsis.  We discussed obesity as a disease and the importance of a more detailed evaluation of all the factors contributing to the disease.  We discussed the importance of long term lifestyle changes which include nutrition, exercise and behavioral modifications as well as the importance of customizing this to MJ's specific health and social needs.  We discussed the benefits of reaching a healthier weight to alleviate the symptoms of existing conditions and reduce the risks of the biomechanical, metabolic and psychological effects of obesity.  Myella Okun appears to be in the action stage of change and states they are ready to start intensive lifestyle modifications and behavioral modifications.  30 minutes was spent today on this visit including the above counseling, pre-visit chart review, and post-visit documentation.  Reviewed by clinician on day of visit: allergies, medications, problem list, medical history, surgical history, family history, social history, and previous encounter notes pertinent to obesity diagnosis.   Lyrik Buresh d.  Laken Rog, NP-C

## 2023-08-17 DIAGNOSIS — H6123 Impacted cerumen, bilateral: Secondary | ICD-10-CM | POA: Diagnosis not present

## 2023-08-24 ENCOUNTER — Encounter (INDEPENDENT_AMBULATORY_CARE_PROVIDER_SITE_OTHER): Payer: Self-pay | Admitting: Family Medicine

## 2023-08-24 ENCOUNTER — Ambulatory Visit (INDEPENDENT_AMBULATORY_CARE_PROVIDER_SITE_OTHER): Payer: BC Managed Care – PPO | Admitting: Family Medicine

## 2023-08-24 VITALS — BP 107/75 | HR 79 | Temp 98.4°F | Ht 64.0 in | Wt 245.0 lb

## 2023-08-24 DIAGNOSIS — Z1331 Encounter for screening for depression: Secondary | ICD-10-CM

## 2023-08-24 DIAGNOSIS — R5383 Other fatigue: Secondary | ICD-10-CM | POA: Diagnosis not present

## 2023-08-24 DIAGNOSIS — E669 Obesity, unspecified: Secondary | ICD-10-CM | POA: Diagnosis not present

## 2023-08-24 DIAGNOSIS — E538 Deficiency of other specified B group vitamins: Secondary | ICD-10-CM

## 2023-08-24 DIAGNOSIS — Z6841 Body Mass Index (BMI) 40.0 and over, adult: Secondary | ICD-10-CM | POA: Diagnosis not present

## 2023-08-24 DIAGNOSIS — R0602 Shortness of breath: Secondary | ICD-10-CM | POA: Diagnosis not present

## 2023-08-24 DIAGNOSIS — R7303 Prediabetes: Secondary | ICD-10-CM

## 2023-08-24 DIAGNOSIS — E282 Polycystic ovarian syndrome: Secondary | ICD-10-CM

## 2023-08-24 DIAGNOSIS — F900 Attention-deficit hyperactivity disorder, predominantly inattentive type: Secondary | ICD-10-CM

## 2023-08-24 DIAGNOSIS — E559 Vitamin D deficiency, unspecified: Secondary | ICD-10-CM

## 2023-08-24 DIAGNOSIS — F3181 Bipolar II disorder: Secondary | ICD-10-CM | POA: Diagnosis not present

## 2023-08-24 NOTE — Assessment & Plan Note (Signed)
Diagnosed somewhere between 2019-2021.  She was previously on Metformin and then stopped it due to [employer] syndrome from one of her medications.  She restarted metformin again about a month ago and has had no issues.  Last A1c was 6.1 in August. Needs Al1c, Insulin and CMP today.

## 2023-08-24 NOTE — Assessment & Plan Note (Signed)
On Strattera without much relief in symptoms of concentration.  Has psychiatrist that is managing medications.

## 2023-08-24 NOTE — Progress Notes (Signed)
Chief Complaint:  Obesity   Subjective:  Janet Palmer (MR# [id number]) is a 27 y.o. adult who presents for evaluation and treatment of obesity and related comorbidities.   Janet Palmer is currently in the action stage of change and ready to dedicate time achieving and maintaining a healthier weight. Janet Palmer is interested in becoming our patient and working on intensive lifestyle modifications including (but not limited to) diet and exercise for weight loss.  Janet Palmer has been struggling with Janet Palmer's weight. Janet Palmer has been unsuccessful in either losing weight, maintaining weight loss, or reaching Janet Palmer's healthy weight goal.  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  Hours are 8-5 for 5 days a week.  Never takes call.  Lives with Scientist, forensic.  Has a partner of 5 years that lives across the country. Father is local, mother lives in [place]. Not anticipating sabotage. Works from home 4 days a week. Desired weight is 130lbs but can't remember the last time at that weight.  Previously struggled with anorexia for at least a year.  Never consumed more than 1200 calories in a day.  Eats out 4-5 times a week if not feeling like cooking.  Loves to cook- "happy place".  Hates cooked vegetables but loves raw vegetables.  Dislikes squash. Sometimes skips lunch- this is due to lack of time.   Food Recall: Bagel or cereal in the am- will do normal sized bagel with either cream cheese or nothing.  Cereal will be Special K 1.5 cups with almond milk and drinks milk afterwards.   Feels satisfied with either option.  Between 12p-1p will be a cup of microwave macaroni and cheese.  Feels satisfied from that.  Sometimes is hungry sometimes eats due to knowing that is only opportunity to eat.  Eats again between 5-7pm.  That varies- may be pasta with sauce and cheese and occasionally a meat protein. At least a cup of pasta. Sparkling water with dinner.  Sometimes may have a frozen greek yogurt bar after dinner but that is not  consistent.      Indirect Calorimeter completed today shows a RMR: 1685. Janet Palmer's calculated basal metabolic rate is 8295 thus Janet Palmer's basal metabolic rate is worse than expected.  Other Fatigue Janet Palmer denies daytime somnolence and denies waking up still tired. Patient has a history of symptoms of daytime fatigue. Janet Palmer generally gets 7-10 hours of sleep per night, and states that Janet Palmer has generally restful sleep. Snoring are not present. Apneic episodes are not present. Epworth Sleepiness Score is 4.   Shortness of Breath Janet Palmer notes increasing shortness of breath with exercising and seems to be worsening over time with weight gain. Janet Palmer notes getting out of breath sooner with activity than Janet Palmer used to. This has gotten worse recently. Janet Palmer denies shortness of breath at rest or orthopnea.  Depression Screen Janet Palmer's Food and Mood (modified PHQ-9) score was 27.     06/02/2023    3:43 PM  Depression screen PHQ 2/9  Decreased Interest 1  Down, Depressed, Hopeless 2  PHQ - 2 Score 3  Altered sleeping 3  Tired, decreased energy 3  Change in appetite 3  Feeling bad or failure about yourself  2  Trouble concentrating 3  Moving slowly or fidgety/restless 2  Suicidal thoughts 1  PHQ-9 Score 20  Difficult doing work/chores Very difficult     Objective:  No data recorded  No data recorded  No data recorded  No data recorded   EKG: Normal sinus rhythm, rate around  80 bpm.  General: Cooperative, alert, well developed, in no acute distress. HEENT: Conjunctivae and lids unremarkable. Cardiovascular: Regular rhythm.  Lungs: Normal work of breathing. Neurologic: No focal deficits.   Lab Results  Component Value Date   CREATININE 0.52 (L) 08/24/2023   BUN 8 08/24/2023   NA 138 08/24/2023   K 4.7 08/24/2023   CL 102 08/24/2023   CO2 21 08/24/2023   Lab Results  Component Value Date   ALT 18 08/24/2023   AST 20 08/24/2023   ALKPHOS 95 08/24/2023   BILITOT 0.2 08/24/2023   Lab  Results  Component Value Date   HGBA1C 6.0 (H) 08/24/2023   HGBA1C 5.6 11/29/2014   Lab Results  Component Value Date   INSULIN 29.1 (H) 08/24/2023   Lab Results  Component Value Date   TSH 2.490 08/24/2023   Lab Results  Component Value Date   CHOL 188 08/24/2023   HDL 46 08/24/2023   LDLCALC 124 (H) 08/24/2023   TRIG 96 08/24/2023   CHOLHDL 2.6 11/29/2014   Lab Results  Component Value Date   WBC 8.1 08/24/2023   HGB 13.8 08/24/2023   HCT 43.2 08/24/2023   MCV 91 08/24/2023   PLT 416 08/24/2023   No results found for: "IRON", "TIBC", "FERRITIN"  Assessment and Plan:   Other Fatigue  Janet Palmer does feel that Janet Palmer's weight is causing Janet Palmer's energy to be lower than it should be. Fatigue may be related to obesity, depression or many other causes. Labs will be ordered, and in the meanwhile, Janet Palmer will focus on self care including making healthy food choices, increasing physical activity and focusing on stress reduction.  Shortness of Breath  Janet Palmer does feel that Janet Palmer gets out of breath more easily that Janet Palmer used to when Janet Palmer exercises. 's shortness of breath appears to be obesity related and exercise induced. Janet Palmer has agreed to work on weight loss and gradually increase exercise to treat Janet Palmer's exercise induced shortness of breath. Will continue to monitor closely.    Problem List Items Addressed This Visit       Endocrine   Polycystic ovarian syndrome    Has frequent spotting but not a full period in a bit of time.  Has provera to help initiate a period.         Other   Attention deficit hyperactivity disorder, inattentive type    On Strattera without much relief in symptoms of concentration.  Has psychiatrist that is managing medications.       Prediabetes    Diagnosed somewhere between 2019-2021.  She was previously on Metformin and then stopped it due to [employer] syndrome from one of her medications.  She restarted metformin again about a month ago and has had no  issues.  Last A1c was 6.1 in August. Needs Al1c, Insulin and CMP today.      Relevant Orders   Comprehensive metabolic panel (Completed)   Hemoglobin A1c (Completed)   Insulin, random (Completed)   Vitamin D deficiency    Diagnosed a few years ago.  Not on any supplementation currently.  Does have fatigue. Vitamin D level today.      Relevant Orders   VITAMIN D 25 Hydroxy (Vit-D Deficiency, Fractures) (Completed)   B12 deficiency    Historical diagnosis. Not on B12 currently.  Thinks this may have coincided with previous metformin administration.      Relevant Orders   Vitamin B12 (Completed)   Other Visit Diagnoses     Other fatigue    -  Primary   Relevant Orders   EKG 12-Lead (Completed)   T4, free (Completed)   T3 (Completed)   Folate (Completed)   TSH (Completed)   SOBOE (shortness of breath on exertion)       Relevant Orders   CBC with Differential/Platelet (Completed)   Lipid Panel With LDL/HDL Ratio (Completed)   PCOS (polycystic ovarian syndrome)       Relevant Orders   Insulin, random (Completed)   BMI 40.0-44.9, adult (HCC)       Obesity with starting BMI of 42.2           Janet Palmer is currently in the action stage of change and Janet Palmer's goal is to continue with weight loss efforts. I recommend Janet Palmer begin the structured treatment plan as follows:  Janet Palmer has agreed to Category 2 Plan  Exercise goals: No exercise has been prescribed at this time.  Behavioral modification strategies:increasing lean protein intake, increasing vegetables, meal planning and cooking strategies, and planning for success  Janet Palmer was informed of the importance of frequent follow-up visits to maximize Janet Palmer's success with intensive lifestyle modifications for Janet Palmer's multiple health conditions. Janet Palmer was informed we would discuss Janet Palmer's lab results at Janet Palmer's next visit unless there is a critical issue that needs to be addressed sooner. Janet Palmer agreed to keep Janet Palmer's next visit at the agreed upon time to discuss  these results.   Attestation Statements: This is the patient's first visit at [facility]. The patient's NEW PATIENT PACKET was reviewed at length. Included in the packet: current and past health history, medications, allergies, ROS, gynecologic history (women only), surgical history, family history, social history, weight history, weight loss surgery history (for those that have had weight loss surgery), nutritional evaluation, mood and food questionnaire, PHQ9, Epworth questionnaire, sleep habits questionnaire, patient life and health improvement goals questionnaire. These will all be scanned into the patient's chart under media.   During the visit, I independently reviewed the patient's EKG, bioimpedance scale results, and indirect calorimeter results. I used this information to tailor a meal plan for the patient that will help Janet Palmer to lose weight and will improve Janet Palmer's obesity-related conditions going forward. I performed a medically necessary appropriate examination and/or evaluation. I discussed the assessment and treatment plan with the patient. The patient was provided an opportunity to ask questions and all were answered. The patient agreed with the plan and demonstrated an understanding of the instructions. Labs were ordered at this visit and will be reviewed at the next visit unless more critical results need to be addressed immediately. Clinical information was updated and documented in the EMR.   Reviewed by clinician on day of visit: allergies, medications, problem list, medical history, surgical history, family history, social history, and previous encounter notes.  Time spent on visit including pre-visit chart review and post-visit charting and care was 45 minutes.   Reuben Likes, MD

## 2023-08-24 NOTE — Assessment & Plan Note (Signed)
Diagnosed a few years ago.  Not on any supplementation currently.  Does have fatigue. Vitamin D level today.

## 2023-08-24 NOTE — Assessment & Plan Note (Signed)
Has frequent spotting but not a full period in a bit of time.  Has provera to help initiate a period.

## 2023-08-24 NOTE — Assessment & Plan Note (Signed)
Historical diagnosis. Not on B12 currently.  Thinks this may have coincided with previous metformin administration.

## 2023-08-26 LAB — COMPREHENSIVE METABOLIC PANEL
ALT: 18 [IU]/L (ref 0–32)
AST: 20 [IU]/L (ref 0–40)
Albumin: 3.9 g/dL — ABNORMAL LOW (ref 4.0–5.0)
Alkaline Phosphatase: 95 [IU]/L (ref 44–121)
BUN/Creatinine Ratio: 15 (ref 9–23)
BUN: 8 mg/dL (ref 6–20)
Bilirubin Total: 0.2 mg/dL (ref 0.0–1.2)
CO2: 21 mmol/L (ref 20–29)
Calcium: 9.3 mg/dL (ref 8.7–10.2)
Chloride: 102 mmol/L (ref 96–106)
Creatinine, Ser: 0.52 mg/dL — ABNORMAL LOW (ref 0.57–1.00)
Globulin, Total: 3.6 g/dL (ref 1.5–4.5)
Glucose: 93 mg/dL (ref 70–99)
Potassium: 4.7 mmol/L (ref 3.5–5.2)
Sodium: 138 mmol/L (ref 134–144)
Total Protein: 7.5 g/dL (ref 6.0–8.5)
eGFR: 132 mL/min/{1.73_m2} (ref >59)

## 2023-08-26 LAB — LIPID PANEL WITH LDL/HDL RATIO
Cholesterol, Total: 188 mg/dL (ref 100–199)
HDL: 46 mg/dL (ref >39)
LDL Chol Calc (NIH): 124 mg/dL — ABNORMAL HIGH (ref 0–99)
LDL/HDL Ratio: 2.7 {ratio} (ref 0.0–3.2)
Triglycerides: 96 mg/dL (ref 0–149)
VLDL Cholesterol Cal: 18 mg/dL (ref 5–40)

## 2023-08-26 LAB — CBC WITH DIFFERENTIAL/PLATELET
Basophils Absolute: 0.1 10*3/uL (ref 0.0–0.2)
Basos: 1 %
EOS (ABSOLUTE): 0.2 10*3/uL (ref 0.0–0.4)
Eos: 2 %
Hematocrit: 43.2 % (ref 34.0–46.6)
Hemoglobin: 13.8 g/dL (ref 11.1–15.9)
Immature Grans (Abs): 0 10*3/uL (ref 0.0–0.1)
Immature Granulocytes: 0 %
Lymphocytes Absolute: 2.9 10*3/uL (ref 0.7–3.1)
Lymphs: 36 %
MCH: 29.1 pg (ref 26.6–33.0)
MCHC: 31.9 g/dL (ref 31.5–35.7)
MCV: 91 fL (ref 79–97)
Monocytes Absolute: 0.6 10*3/uL (ref 0.1–0.9)
Monocytes: 8 %
Neutrophils Absolute: 4.3 10*3/uL (ref 1.4–7.0)
Neutrophils: 53 %
Platelets: 416 10*3/uL (ref 150–450)
RBC: 4.74 x10E6/uL (ref 3.77–5.28)
RDW: 13.1 % (ref 11.7–15.4)
WBC: 8.1 10*3/uL (ref 3.4–10.8)

## 2023-08-26 LAB — HEMOGLOBIN A1C
Est. average glucose Bld gHb Est-mCnc: 126 mg/dL
Hgb A1c MFr Bld: 6 % — ABNORMAL HIGH (ref 4.8–5.6)

## 2023-08-26 LAB — T4, FREE: Free T4: 1.12 ng/dL (ref 0.82–1.77)

## 2023-08-26 LAB — VITAMIN D 25 HYDROXY (VIT D DEFICIENCY, FRACTURES): Vit D, 25-Hydroxy: 14.9 ng/mL — ABNORMAL LOW (ref 30.0–100.0)

## 2023-08-26 LAB — TSH: TSH: 2.49 u[IU]/mL (ref 0.450–4.500)

## 2023-08-26 LAB — VITAMIN B12: Vitamin B-12: 399 pg/mL (ref 232–1245)

## 2023-08-26 LAB — T3: T3, Total: 166 ng/dL (ref 71–180)

## 2023-08-26 LAB — INSULIN, RANDOM: INSULIN: 29.1 u[IU]/mL — ABNORMAL HIGH (ref 2.6–24.9)

## 2023-08-26 LAB — FOLATE: Folate: 6.5 ng/mL (ref >3.0)

## 2023-09-02 DIAGNOSIS — Z1329 Encounter for screening for other suspected endocrine disorder: Secondary | ICD-10-CM | POA: Diagnosis not present

## 2023-09-02 DIAGNOSIS — E282 Polycystic ovarian syndrome: Secondary | ICD-10-CM | POA: Diagnosis not present

## 2023-09-07 ENCOUNTER — Ambulatory Visit (INDEPENDENT_AMBULATORY_CARE_PROVIDER_SITE_OTHER): Payer: BC Managed Care – PPO | Admitting: Family Medicine

## 2023-09-07 VITALS — BP 114/80 | HR 75 | Temp 97.9°F | Ht 64.0 in | Wt 244.0 lb

## 2023-09-07 DIAGNOSIS — F3181 Bipolar II disorder: Secondary | ICD-10-CM | POA: Diagnosis not present

## 2023-09-07 DIAGNOSIS — E559 Vitamin D deficiency, unspecified: Secondary | ICD-10-CM

## 2023-09-07 DIAGNOSIS — Z6841 Body Mass Index (BMI) 40.0 and over, adult: Secondary | ICD-10-CM

## 2023-09-07 DIAGNOSIS — E669 Obesity, unspecified: Secondary | ICD-10-CM

## 2023-09-07 DIAGNOSIS — R7303 Prediabetes: Secondary | ICD-10-CM | POA: Diagnosis not present

## 2023-09-07 MED ORDER — VITAMIN D3 125 MCG (5000 UT) PO CAPS: 5000.0000 [IU] | ORAL_CAPSULE | Freq: Every day | ORAL | Status: AC

## 2023-09-07 NOTE — Progress Notes (Signed)
   SUBJECTIVE:  Chief Complaint: Obesity  Interim History: Patient felt like they didn't do well on the meal plan.  Felt the plan was more difficult than expected.  Ate more vegetables than previously. Plan felt restrictive and felt like falling back into an eating disorder.  Didn't use the scale and eyeballed quantity. Hasn't been eating breakfast in the am.  Wondering about how to be more flexible with meal plan. For the next few weeks patient has a few Christmas celebrations but that isn't until the end of the month. This weekend meeting up with a few friends.  Janet Palmer is here to discuss Janet Palmer's progress with Janet Palmer's obesity treatment plan. Janet Palmer thinks the plan is Category 3 Plan and states Janet Palmer is following Janet Palmer's eating plan approximately 30-40 % of the time. Janet Palmer states Janet Palmer is walking.   OBJECTIVE: Visit Diagnoses: Problem List Items Addressed This Visit       Other   Prediabetes   Pathophysiology of progression through insulin resistance to prediabetes and diabetes was discussed at length today.  Patient to continue to monitor and be in control of total intake of snack calories which may be simple carbohydrates but should be consumed only after the patient has taken in all the nutrition for the day.  Macronutrient identification, classification and daily intake ratios were discussed.  Plan to repeat labs in 3 months to monitor both hemoglobin A1c and insulin levels.  No medications at this time as patient is not having significant hunger or cravings that would make following meal plan more difficult.         Vitamin D deficiency - Primary   Discussed importance of vitamin d supplementation.  Vitamin d supplementation has been shown to decrease fatigue, decrease risk of progression to insulin resistance and then prediabetes, decreases risk of falling in older age and can even assist in decreasing depressive symptoms in PTSD.   Patient encouraged to take OTC Vit D 5k daily      Relevant Medications    Cholecalciferol (VITAMIN D3) 125 MCG (5000 UT) CAPS   Other Visit Diagnoses       BMI 40.0-44.9, adult (HCC)         Obesity with starting BMI of 42.2           No data recorded No data recorded No data recorded No data recorded   ASSESSMENT AND PLAN:  Diet: Janet Palmer is currently in the action stage of change. As such, Janet Palmer's goal is to continue with weight loss efforts. Janet Palmer has agreed to Category 3 Plan.  Exercise: Janet Palmer has been instructed that some exercise is better than none for weight loss and overall health benefits.   Behavior Modification:  We discussed the following Behavioral Modification Strategies today: increasing lean protein intake, increasing vegetables, meal planning and cooking strategies, holiday eating strategies, and planning for success.   Return in about 3 weeks (around 09/28/2023).. Janet Palmer was informed of the importance of frequent follow up visits to maximize Janet Palmer's success with intensive lifestyle modifications for Janet Palmer's multiple health conditions.  Attestation Statements:   Reviewed by clinician on day of visit: allergies, medications, problem list, medical history, surgical history, family history, social history, and previous encounter notes.   Time spent on visit including pre-visit chart review and post-visit care and charting was 45 minutes.    Reuben Likes, MD

## 2023-09-17 NOTE — Assessment & Plan Note (Addendum)
Discussed importance of vitamin d supplementation.  Vitamin d supplementation has been shown to decrease fatigue, decrease risk of progression to insulin resistance and then prediabetes, decreases risk of falling in older age and can even assist in decreasing depressive symptoms in PTSD.   Patient encouraged to take OTC Vit D 5k daily

## 2023-09-17 NOTE — Assessment & Plan Note (Signed)

## 2023-09-18 ENCOUNTER — Telehealth (HOSPITAL_COMMUNITY): Payer: BC Managed Care – PPO | Admitting: Psychiatry

## 2023-09-18 ENCOUNTER — Encounter (HOSPITAL_COMMUNITY): Payer: Self-pay | Admitting: Psychiatry

## 2023-09-18 DIAGNOSIS — F900 Attention-deficit hyperactivity disorder, predominantly inattentive type: Secondary | ICD-10-CM

## 2023-09-18 DIAGNOSIS — F3181 Bipolar II disorder: Secondary | ICD-10-CM | POA: Diagnosis not present

## 2023-09-18 MED ORDER — GUANFACINE HCL ER 1 MG PO TB24: 1.0000 mg | ORAL_TABLET | Freq: Every day | ORAL | 2 refills | Status: DC

## 2023-09-18 MED ORDER — LISDEXAMFETAMINE DIMESYLATE 20 MG PO CAPS: 20.0000 mg | ORAL_CAPSULE | Freq: Every day | ORAL | 0 refills | Status: DC

## 2023-09-18 MED ORDER — ARIPIPRAZOLE 5 MG PO TABS: 7.5000 mg | ORAL_TABLET | Freq: Every day | ORAL | 0 refills | Status: DC

## 2023-09-18 NOTE — Progress Notes (Addendum)
Virtual Visit via Video Note  I connected with Janet Palmer on 09/18/23 at  9:00 AM EST by a video enabled telemedicine application and verified that I am speaking with the correct person using two identifiers.  Location: Patient: home Provider: office   I discussed the limitations of evaluation and management by telemedicine and the availability of in person appointments. The patient expressed understanding and agreed to proceed.    I discussed the assessment and treatment plan with the patient. The patient was provided an opportunity to ask questions and all were answered. The patient agreed with the plan and demonstrated an understanding of the instructions.   The patient was advised to call back or seek an in-person evaluation if the symptoms worsen or if the condition fails to improve as anticipated.  I provided 60 minutes of non-face-to-face time during this encounter.   [name], MD   Psychiatric Initial Adult Assessment   Patient Identification: Janet Palmer MRN:  [id number] Date of Evaluation:  09/18/2023 Referral Source: self Chief Complaint:   Chief Complaint  Patient presents with   Depression   ADD   Anxiety   Establish Care   Visit Diagnosis:    ICD-10-CM   1. Bipolar 2 disorder (HCC)  F31.81     2. Attention deficit hyperactivity disorder, inattentive type  F90.0       History of Present Illness: This patient is a 27 year old nonbinary person who lives alone in [place].  They work in patient rescheduling for psychiatric office.  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  The patient states that they have dealt with depression and hypomanic symptoms since about age 27 They attempted suicide by hanging but claims they were not really serious at that time and was just trying to get help.  They have been seeing psychiatrist and therapist ever since. They have had a long history of depressed mood punctuated by hypomanic episodes as well as anxiety.  They state that over  the last couple of months his depression has become worse.  They endorse anhedonia low mood not enjoying much in their life.  Some difficulty sleeping.  Variable appetite.  They endorse thoughts of wanting to die when things get difficult although they have never hurt themself and do not plan to.  They have also recently been diagnosed with ADD and has had a lot of trouble with focus attention span difficulty completing tasks as well as poor motivation.  They tried Strattera but with no effect.  The Intuniv does help to some degree that they take at bedtime.  They also take Abilify which has helped with the mood swings and depression.  The patient has tried antidepressants in the past.  The most recent 1 was Zoloft which caused hypomanic symptoms.  They are supposed to be taking nortriptyline for chronic back pain but are afraid to start it worrying about the causing of suicidal ideation.  They also tend to have OCD and ruminate and second-guess themself a good deal.  They do not use alcohol drugs cigarettes vaping.  Not currently sexually active.  They do state that the job is very stressful and do not enjoy the job.  They enjoy going to movies reading writing and going to the gym.  They are going to the [facility] to work on healthier eating.  In the past they were restricting calories but never experienced bulimia.  Associated Signs/Symptoms: Depression Symptoms:  depressed mood, anhedonia, psychomotor retardation, difficulty concentrating, anxiety, decreased appetite, (Hypo) Manic Symptoms:  Distractibility, Irritable Mood, Anxiety Symptoms:  Excessive Worry, Obsessive Compulsive Symptoms:   Overthinking everything, Psychotic Symptoms:  none PTSD Symptoms: none  Past Psychiatric History: Long-term outpatient treatment.  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  Consequences of Substance Abuse: Negative  Past Medical History:  Past Medical History:  Diagnosis Date   ADHD    Allergy    Anxiety    B12 deficiency    Back pain    Bipolar 2 disorder (HCC)    Constipation    Depression    Eating disorder    Fibromyalgia    Heartburn    Joint pain    OCD (obsessive compulsive disorder)    PCOS (polycystic ovarian syndrome)    Prediabetes    PTSD (post-traumatic stress disorder)    Vitamin D deficiency     Past Surgical History:  Procedure Laterality Date   arm fracture Left 2006   FRACTURE SURGERY     WISDOM TOOTH EXTRACTION  2017    Family Psychiatric History: The patient's mother has a history of depression anxiety and alcohol abuse she has been sober for about a year.  The patient's father also has depression and anxiety.  Maternal grandmother also had a history of depression and anxiety.  Patient's brother has a history of depression and OCD  Family History:  Family History  Problem Relation Age of Onset   Diabetes Mother    Depression Mother    Alcohol abuse Mother    Anxiety disorder Mother    Obesity Mother    Anxiety disorder Father    Thyroid disease Father    Hypertension Father    Hyperlipidemia Father    Stroke Father    Sleep apnea Father    OCD Brother    Depression Brother    Cancer Maternal Grandfather        pancreatic   Hypertension Maternal Grandfather    Diabetes Maternal Grandmother    Cancer Maternal Grandmother        lung with METS   Depression Maternal Grandmother     Social History:   Social History   Socioeconomic History   Marital status: Single    Spouse name: Not on file   Number of children: Not on file   Years of education: Not on file   Highest education level: Bachelor's degree (e.g., BA, AB, BS)  Occupational History   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[employer]  Tobacco  Use   Smoking status: Never   Smokeless tobacco: Never  Vaping Use   Vaping status: Never Used  Substance and Sexual Activity   Alcohol use: Not Currently   Drug use: Never   Sexual activity: Not Currently    Partners: Female, Female    Birth control/protection: Other-see comments    Comment: Lack of penetrative sex  Other Topics Concern   Not on file  Social History Narrative   Not on file   Social Drivers of Health   Financial Resource Strain: Medium Risk (07/17/2023)   Overall Financial Resource Strain (CARDIA)    Difficulty of Paying Living Expenses: Somewhat hard  Food Insecurity: No Food Insecurity (07/17/2023)   Hunger Vital Sign    Worried About Running Out of Food in the Last Year: Never true    Ran Out of Food in  the Last Year: Never true  Transportation Needs: No Transportation Needs (07/17/2023)   PRAPARE - Administrator, Civil Service (Medical): No    Lack of Transportation (Non-Medical): No  Physical Activity: Insufficiently Active (07/17/2023)   Exercise Vital Sign    Days of Exercise per Week: 3 days    Minutes of Exercise per Session: 20 min  Stress: Stress Concern Present (07/17/2023)   [employer] of Occupational Health - Occupational Stress Questionnaire    Feeling of Stress : Very much  Social Connections: Socially Isolated (07/17/2023)   Social Connection and Isolation Panel [NHANES]    Frequency of Communication with Friends and Family: Twice a week    Frequency of Social Gatherings with Friends and Family: Twice a week    Attends Religious Services: Never    Database administrator or Organizations: No    Attends Engineer, structural: Not on file    Marital Status: Never married    Additional Social History: The patient grew up in [place].  They lived with  brother and both parents until age 44 when the parents divorced.  They state the mother drank throughout childhood which made things difficult.  They did complete  high school and college at [employer] with a degree in mass communication.  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  Allergies:   Allergies  Allergen Reactions   Amoxicillin    Lamictal [Lamotrigine] Dermatitis    Levonne Spiller syndrome   Motrin [Ibuprofen]    Other Other (See Comments)    Birth Control causes suicidal tendencies   Soy Allergy (Do Not Select) Nausea And Vomiting    Other Reaction(s): GI Intolerance    Metabolic Disorder Labs: Lab Results  Component Value Date   HGBA1C 6.0 (H) 08/24/2023   MPG 114 11/29/2014   Lab Results  Component Value Date   PROLACTIN 25.1 (H) 07/06/2017   PROLACTIN 21.0 11/03/2016   Lab Results  Component Value Date   CHOL 188 08/24/2023   TRIG 96 08/24/2023   HDL 46 08/24/2023   CHOLHDL 2.6 11/29/2014   VLDL 9 11/29/2014   LDLCALC 124 (H) 08/24/2023   LDLCALC 76 11/29/2014   Lab Results  Component Value Date   TSH 2.490 08/24/2023    Therapeutic Level Labs: No results found for: "LITHIUM" No results found for: "CBMZ" No results found for: "VALPROATE"  Current Medications: Current Outpatient Medications  Medication Sig Dispense Refill   lisdexamfetamine (VYVANSE) 20 MG capsule Take 1 capsule (20 mg total) by mouth daily. 30 capsule 0   ARIPiprazole (ABILIFY) 5 MG tablet Take 1.5 tablets (7.5 mg total) by mouth daily. 45 tablet 0   Cholecalciferol (VITAMIN D3) 125 MCG (5000 UT) CAPS Take 1 capsule (5,000 Units total) by mouth daily.     diazepam (VALIUM) 5 MG tablet      guanFACINE (INTUNIV) 1 MG TB24 ER tablet Take 1 tablet (1 mg total) by mouth daily. 30 tablet 2   hydrOXYzine (ATARAX) 10 MG tablet Take by mouth.     medroxyPROGESTERone (PROVERA) 10 MG tablet If no period > 90 days, take one tablet daily x 10 days     metFORMIN (GLUCOPHAGE-XR) 500 MG 24 hr tablet Take 1 tablet (500 mg total) by mouth daily with breakfast. 30 tablet 2   nortriptyline (PAMELOR) 10 MG capsule      No current  facility-administered medications for this visit.    Musculoskeletal: Strength & Muscle  Tone: within normal limits Gait & Station: normal Patient leans: N/A  Psychiatric Specialty Exam: Review of Systems  Constitutional:  Positive for fatigue.  Musculoskeletal:  Positive for back pain and myalgias.  Psychiatric/Behavioral:  Positive for decreased concentration and dysphoric mood. The patient is nervous/anxious.     There were no vitals taken for this visit.There is no height or weight on file to calculate BMI.  General Appearance: Casual and Fairly Groomed  Eye Contact:  Good  Speech:  Clear and Coherent  Volume:  Normal  Mood:  Anxious and Dysphoric  Affect:  Congruent  Thought Process:  Goal Directed  Orientation:  Full (Time, Place, and Person)  Thought Content:  Obsessions and Rumination  Suicidal Thoughts:  No  Homicidal Thoughts:  No  Memory:  Immediate;   Good Recent;   Good Remote;   Good  Judgement:  Good  Insight:  Good  Psychomotor Activity:  Decreased  Concentration:  Concentration: Poor and Attention Span: Poor  Recall:  Good  Fund of Knowledge:Good  Language: Good  Akathisia:  No  Handed:  Right  AIMS (if indicated):  not done  Assets:  Communication Skills Desire for Improvement Resilience Social Support Vocational/Educational  ADL's:  Intact  Cognition: WNL  Sleep:  Fair   Screenings: PHQ2-9    Flowsheet Row Video Visit [facility]/18/2023 in [facility] Office Visit from 06/02/2023 in [facility] [place]  PHQ-2 Total Score 3 3  PHQ-9 Total Score 19 20      Flowsheet Row Video Visit from 09/18/2023 in [facility]  C-SSRS RISK CATEGORY Error: Q3, 4, or 5 should not be populated when Q2 is No       Assessment and Plan: This patient is a 28 year old nonbinary individual with a long history of depression hypomanic symptoms difficulty with  focus and concentration.  Their main concern today is depression but they are wary of getting on any antidepressants.  I suggested a small increase in Abilify to 7.5 mg daily and they are in agreement.  For now they can continue the Intuniv which seems to have helped some with focus at 1 mg daily.  I urged him to also try the nortriptyline 10 mg prescribed for pain as it also helps mood.  In terms of the ADD I suggest that we try a stimulant such as Vyvanse at 20 mg dosage.  They are in agreement with it.  They will return to see me in 4 weeks  Collaboration of Care: Primary Care Provider AEB notes are shared with PCP on the epic system.  The patient is also seeing a therapist through virtual platform  Patient/Guardian was advised Release of Information must be obtained prior to any record release in order to collaborate their care with an outside provider. Patient/Guardian was advised if they have not already done so to contact the registration department to sign all necessary forms in order for [place] to release information regarding their care.   Consent: Patient/Guardian gives verbal consent for treatment and assignment of benefits for services provided during this visit. Patient/Guardian expressed understanding and agreed to proceed.   [name], MD 12/21/20249:38 AM

## 2023-10-05 DIAGNOSIS — F3181 Bipolar II disorder: Secondary | ICD-10-CM | POA: Diagnosis not present

## 2023-10-12 ENCOUNTER — Telehealth (HOSPITAL_COMMUNITY): Payer: Self-pay

## 2023-10-12 ENCOUNTER — Other Ambulatory Visit (HOSPITAL_COMMUNITY): Payer: Self-pay | Admitting: Psychiatry

## 2023-10-12 MED ORDER — LISDEXAMFETAMINE DIMESYLATE 20 MG PO CAPS: 20.0000 mg | ORAL_CAPSULE | Freq: Every day | ORAL | 0 refills | Status: DC

## 2023-10-12 NOTE — Telephone Encounter (Signed)
 sent

## 2023-10-12 NOTE — Telephone Encounter (Signed)
Called pt no answer left vm to check pharmacy 

## 2023-10-12 NOTE — Telephone Encounter (Signed)
 Pt called in requesting a refill on lisdexamfetamine (VYVANSE) 20 MG capsule sent to [facility] on [address], pt is scheduled for 10/20/23. Please advise.

## 2023-10-13 ENCOUNTER — Ambulatory Visit (INDEPENDENT_AMBULATORY_CARE_PROVIDER_SITE_OTHER): Payer: BC Managed Care – PPO | Admitting: Family Medicine

## 2023-10-13 ENCOUNTER — Encounter (INDEPENDENT_AMBULATORY_CARE_PROVIDER_SITE_OTHER): Payer: Self-pay | Admitting: Family Medicine

## 2023-10-13 VITALS — BP 108/68 | HR 64 | Temp 97.8°F | Ht 64.0 in | Wt 243.0 lb

## 2023-10-13 DIAGNOSIS — E669 Obesity, unspecified: Secondary | ICD-10-CM

## 2023-10-13 DIAGNOSIS — Z6841 Body Mass Index (BMI) 40.0 and over, adult: Secondary | ICD-10-CM

## 2023-10-13 DIAGNOSIS — F429 Obsessive-compulsive disorder, unspecified: Secondary | ICD-10-CM

## 2023-10-13 NOTE — Progress Notes (Signed)
   SUBJECTIVE:  Chief Complaint: Obesity  Interim History: Patient voices that she is really struggling with food intake and obsessing about that.  She is both undereating and overeating. Believes the cyclical eating disorder symptoms kicked off after getting pictures taken at a convention.   Janet Palmer is here to discuss Janet Palmer's progress with Janet Palmer's obesity treatment plan. Janet Palmer is on the Category 3 Plan and states Janet Palmer is following Janet Palmer's eating plan approximately 50 % of the time. Janet Palmer states Janet Palmer is exercising 20 minutes 2 times per week.   OBJECTIVE: Visit Diagnoses: Problem List Items Addressed This Visit       Other   OCD (obsessive compulsive disorder) - Primary   Patient and I discussed at length today her OCD and how easy it could be for her to fall back into an obsessive state in terms of monitoring food intake calories and nutrition.  She mentions that she does sometimes worry she is going to fall back into that because she rationalizes things in her brain or even feels poorly if she makes certain food decisions in terms of content or quantity.  We discussed the importance of eating and opting to have something over nothing regardless of what the choices.  We discussed following up via MyChart messages to ensure patient is getting in 1 meal or snacking throughout the day to ensure she is getting consistent intake of calories.  Will follow-up at next appointment to see where patient's mindset is in terms of her food intake.  She feels confident in starting psychotherapy with Dr. Dewaine Conger to address her emotional eating and her relationship with food.         10/22/2023    3:53 PM 10/13/2023   12:00 PM 09/07/2023    9:00 AM  Vitals with BMI  Height 5\' 4"  5\' 4"  5\' 4"   Weight 250 lbs 243 lbs 244 lbs  BMI 42.89 41.69 41.86  Systolic 126 108 621  Diastolic 74 68 80  Pulse 80 64 75    No data recorded No data recorded No data recorded No data recorded   ASSESSMENT AND PLAN:  Diet: Walker is  currently in the action stage of change. As such, Janet Palmer's goal is to continue with weight loss efforts. Janet Palmer has agreed to practicing portion control and making smarter food choices, such as increasing vegetables and decreasing simple carbohydrates.  Exercise: Fransisca has been instructed that some exercise is better than none for weight loss and overall health benefits.   Behavior Modification:  We discussed the following Behavioral Modification Strategies today: increasing lean protein intake, increasing vegetables, no skipping meals, meal planning and cooking strategies, and better snacking choices.   No follow-ups on file.. Janet Palmer was informed of the importance of frequent follow up visits to maximize Janet Palmer's success with intensive lifestyle modifications for Janet Palmer's multiple health conditions.  Attestation Statements:   Reviewed by clinician on day of visit: allergies, medications, problem list, medical history, surgical history, family history, social history, and previous encounter notes.      Reuben Likes, MD

## 2023-10-15 ENCOUNTER — Encounter (INDEPENDENT_AMBULATORY_CARE_PROVIDER_SITE_OTHER): Payer: Self-pay | Admitting: Family Medicine

## 2023-10-15 DIAGNOSIS — E282 Polycystic ovarian syndrome: Secondary | ICD-10-CM | POA: Diagnosis not present

## 2023-10-15 DIAGNOSIS — N939 Abnormal uterine and vaginal bleeding, unspecified: Secondary | ICD-10-CM | POA: Diagnosis not present

## 2023-10-18 NOTE — Telephone Encounter (Signed)
Please review

## 2023-10-19 ENCOUNTER — Other Ambulatory Visit: Payer: Self-pay | Admitting: Family Medicine

## 2023-10-19 DIAGNOSIS — R7303 Prediabetes: Secondary | ICD-10-CM

## 2023-10-19 NOTE — Telephone Encounter (Signed)
FYI

## 2023-10-20 ENCOUNTER — Telehealth (INDEPENDENT_AMBULATORY_CARE_PROVIDER_SITE_OTHER): Payer: BC Managed Care – PPO | Admitting: Psychiatry

## 2023-10-20 ENCOUNTER — Encounter (HOSPITAL_COMMUNITY): Payer: Self-pay | Admitting: Psychiatry

## 2023-10-20 DIAGNOSIS — F3181 Bipolar II disorder: Secondary | ICD-10-CM | POA: Diagnosis not present

## 2023-10-20 DIAGNOSIS — F900 Attention-deficit hyperactivity disorder, predominantly inattentive type: Secondary | ICD-10-CM

## 2023-10-20 MED ORDER — LISDEXAMFETAMINE DIMESYLATE 20 MG PO CAPS
20.0000 mg | ORAL_CAPSULE | Freq: Every day | ORAL | 0 refills | Status: DC
Start: 2023-10-20 — End: 2023-11-04

## 2023-10-20 MED ORDER — GUANFACINE HCL ER 1 MG PO TB24: 1.0000 mg | ORAL_TABLET | Freq: Every day | ORAL | 2 refills | Status: DC

## 2023-10-20 MED ORDER — ARIPIPRAZOLE 5 MG PO TABS: 7.5000 mg | ORAL_TABLET | Freq: Every day | ORAL | 0 refills | Status: DC

## 2023-10-20 MED ORDER — LISDEXAMFETAMINE DIMESYLATE 20 MG PO CAPS: 20.0000 mg | ORAL_CAPSULE | Freq: Every day | ORAL | 0 refills | Status: DC

## 2023-10-20 NOTE — Telephone Encounter (Signed)
FYI

## 2023-10-20 NOTE — Progress Notes (Signed)
Virtual Visit via Video Note  I connected with Janet Palmer on 10/20/23 at 11:20 AM EST by a video enabled telemedicine application and verified that I am speaking with the correct person using two identifiers.  Location: Patient: home Provider: office   I discussed the limitations of evaluation and management by telemedicine and the availability of in person appointments. The patient expressed understanding and agreed to proceed.     I discussed the assessment and treatment plan with the patient. The patient was provided an opportunity to ask questions and all were answered. The patient agreed with the plan and demonstrated an understanding of the instructions.   The patient was advised to call back or seek an in-person evaluation if the symptoms worsen or if the condition fails to improve as anticipated.  I provided 20 minutes of non-face-to-face time during this encounter.   Janet Ruder, MD  [facility] MD/PA/NP OP Progress Note  10/20/2023 11:33 AM Janet Palmer  MRN:  [id number]  Chief Complaint:  Chief Complaint  Patient presents with   Depression   ADD   Follow-up   HPI:  This patient is a 28 year old nonbinary person who lives alone in [place].  They work in patient rescheduling for psychiatric office.  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   The patient states that they have dealt with depression and hypomanic symptoms since about age 73 They attempted suicide by hanging but claims they were not really serious at that time and was just trying to get help.  They have been seeing psychiatrist and therapist ever since. They have had a long history of depressed mood punctuated by hypomanic episodes as well as anxiety.   They state that over the last couple of months his depression has become worse.  They endorse anhedonia low mood not enjoying much in their life.  Some difficulty sleeping.  Variable appetite.  They endorse thoughts of wanting to die when things get difficult although they have  never hurt themself and do not plan to.  They have also recently been diagnosed with ADD and has had a lot of trouble with focus attention span difficulty completing tasks as well as poor motivation.  They tried Strattera but with no effect.  The Intuniv does help to some degree that they take at bedtime.  They also take Abilify which has helped with the mood swings and depression.   The patient has tried antidepressants in the past.  The most recent 1 was Zoloft which caused hypomanic symptoms.  They are supposed to be taking nortriptyline for chronic back pain but are afraid to start it worrying about the causing of suicidal ideation.  They also tend to have OCD and ruminate and second-guess themself a good deal.  They do not use alcohol drugs cigarettes vaping.  Not currently sexually active.  They do state that the job is very stressful and do not enjoy the job.  They enjoy going to movies reading writing and going to the gym.  They are going to the [facility] to work on healthier eating.  In the past they were restricting calories but never experienced bulimia  The patient returns for follow-up after 4 weeks regarding depression and ADD.  Last time we added Vyvanse 20 mg every morning to the regimen.  They state this is made a big difference.  They are more productive able to stay focused and get more done.  We also increase the Abilify to 7.5 mg and this seems to have helped mood.  They seem less depressed.  They still do not like their job and are trying to find something else.  They deny severe depression or thoughts of self-harm or significant anxiety. Visit Diagnosis:    ICD-10-CM   1. Bipolar 2 disorder (HCC)  F31.81     2. Attention deficit hyperactivity disorder, inattentive type  F90.0       Past Psychiatric History: Long-term outpatient treatment. Last saw a nurse practitioner in psychiatry at [facility] about 3 months ago   Past Medical History:  Past Medical  History:  Diagnosis Date   ADHD    Allergy    Anxiety    B12 deficiency    Back pain    Bipolar 2 disorder (HCC)    Constipation    Depression    Eating disorder    Fibromyalgia    Heartburn    Joint pain    OCD (obsessive compulsive disorder)    PCOS (polycystic ovarian syndrome)    Prediabetes    PTSD (post-traumatic stress disorder)    Vitamin D deficiency     Past Surgical History:  Procedure Laterality Date   arm fracture Left 2006   FRACTURE SURGERY     WISDOM TOOTH EXTRACTION  2017    Family Psychiatric History: See below  Family History:  Family History  Problem Relation Age of Onset   Diabetes Mother    Depression Mother    Alcohol abuse Mother    Anxiety disorder Mother    Obesity Mother    Anxiety disorder Father    Thyroid disease Father    Hypertension Father    Hyperlipidemia Father    Stroke Father    Sleep apnea Father    OCD Brother    Depression Brother    Cancer Maternal Grandfather        pancreatic   Hypertension Maternal Grandfather    Diabetes Maternal Grandmother    Cancer Maternal Grandmother        lung with METS   Depression Maternal Grandmother     Social History:  Social History   Socioeconomic History   Marital status: Single    Spouse name: Not on file   Number of children: Not on file   Years of education: Not on file   Highest education level: Bachelor's degree (e.g., BA, AB, BS)  Occupational History   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[employer]  Tobacco Use   Smoking status: Never   Smokeless tobacco: Never  Vaping Use   Vaping status: Never Used  Substance and Sexual Activity   Alcohol use: Not Currently   Drug use: Never   Sexual activity: Not Currently    Partners: Female, Female    Birth control/protection: Other-see comments    Comment: Lack of penetrative sex  Other Topics Concern   Not on file  Social History Narrative   Not on file   Social Drivers of Health   Financial Resource  Strain: Medium Risk (10/19/2023)   Overall Financial Resource Strain (CARDIA)    Difficulty of Paying Living Expenses: Somewhat hard  Food Insecurity: No Food Insecurity (10/19/2023)   Hunger Vital Sign    Worried About Running Out of Food in the Last Year: Never true    Ran Out of Food in the Last Year: Never true  Transportation Needs: No Transportation Needs (10/19/2023)   PRAPARE - Administrator, Civil Service (Medical): No    Lack of Transportation (Non-Medical): No  Physical Activity: Insufficiently  Active (10/19/2023)   Exercise Vital Sign    Days of Exercise per Week: 2 days    Minutes of Exercise per Session: 20 min  Stress: Stress Concern Present (10/19/2023)   [employer] of Occupational Health - Occupational Stress Questionnaire    Feeling of Stress : Rather much  Social Connections: Socially Isolated (10/19/2023)   Social Connection and Isolation Panel [NHANES]    Frequency of Communication with Friends and Family: Three times a week    Frequency of Social Gatherings with Friends and Family: Twice a week    Attends Religious Services: Never    Database administrator or Organizations: No    Attends Engineer, structural: Not on file    Marital Status: Never married    Allergies:  Allergies  Allergen Reactions   Amoxicillin    Lamictal [Lamotrigine] Dermatitis    Levonne Spiller syndrome   Motrin [Ibuprofen]    Other Other (See Comments)    Birth Control causes suicidal tendencies   Soy Allergy (Do Not Select) Nausea And Vomiting    Other Reaction(s): GI Intolerance    Metabolic Disorder Labs: Lab Results  Component Value Date   HGBA1C 6.0 (H) 08/24/2023   MPG 114 11/29/2014   Lab Results  Component Value Date   PROLACTIN 25.1 (H) 07/06/2017   PROLACTIN 21.0 11/03/2016   Lab Results  Component Value Date   CHOL 188 08/24/2023   TRIG 96 08/24/2023   HDL 46 08/24/2023   CHOLHDL 2.6 11/29/2014   VLDL 9 11/29/2014   LDLCALC 124 (H)  08/24/2023   LDLCALC 76 11/29/2014   Lab Results  Component Value Date   TSH 2.490 08/24/2023   TSH 2.450 07/06/2017    Therapeutic Level Labs: No results found for: "LITHIUM" No results found for: "VALPROATE" No results found for: "CBMZ"  Current Medications: Current Outpatient Medications  Medication Sig Dispense Refill   lisdexamfetamine (VYVANSE) 20 MG capsule Take 1 capsule (20 mg total) by mouth daily. 30 capsule 0   ARIPiprazole (ABILIFY) 5 MG tablet Take 1.5 tablets (7.5 mg total) by mouth daily. 45 tablet 0   Cholecalciferol (VITAMIN D3) 125 MCG (5000 UT) CAPS Take 1 capsule (5,000 Units total) by mouth daily.     diazepam (VALIUM) 5 MG tablet      guanFACINE (INTUNIV) 1 MG TB24 ER tablet Take 1 tablet (1 mg total) by mouth daily. 30 tablet 2   hydrOXYzine (ATARAX) 10 MG tablet Take by mouth.     lisdexamfetamine (VYVANSE) 20 MG capsule Take 1 capsule (20 mg total) by mouth daily. 30 capsule 0   medroxyPROGESTERone (PROVERA) 10 MG tablet If no period > 90 days, take one tablet daily x 10 days     metFORMIN (GLUCOPHAGE-XR) 500 MG 24 hr tablet TAKE 1 TABLET BY MOUTH EVERY DAY WITH BREAKFAST 90 tablet 0   nortriptyline (PAMELOR) 10 MG capsule      No current facility-administered medications for this visit.     Musculoskeletal: Strength & Muscle Tone: within normal limits Gait & Station: normal Patient leans: N/A  Psychiatric Specialty Exam: Review of Systems  Musculoskeletal:  Positive for arthralgias and back pain.  All other systems reviewed and are negative.   There were no vitals taken for this visit.There is no height or weight on file to calculate BMI.  General Appearance: Casual and Fairly Groomed  Eye Contact:  Good  Speech:  Clear and Coherent  Volume:  Normal  Mood:  Euthymic  Affect:  Congruent  Thought Process:  Goal Directed  Orientation:  Full (Time, Place, and Person)  Thought Content: WDL   Suicidal Thoughts:  No  Homicidal Thoughts:  No   Memory:  Immediate;   Good Recent;   Good Remote;   Good  Judgement:  Good  Insight:  Good  Psychomotor Activity:  Normal  Concentration:  Concentration: Good and Attention Span: Good  Recall:  Good  Fund of Knowledge: Good  Language: Good  Akathisia:  No  Handed:  Right  AIMS (if indicated): not done  Assets:  Communication Skills Desire for Improvement Resilience Social Support  ADL's:  Intact  Cognition: WNL  Sleep:  Good   Screenings: PHQ2-9    Flowsheet Row Video Visit from 09/18/2023 in [facility] Office Visit from 06/02/2023 in [facility] [place]  PHQ-2 Total Score 3 3  PHQ-9 Total Score 19 20      Flowsheet Row Video Visit from 09/18/2023 in [facility]  C-SSRS RISK CATEGORY Error: Q3, 4, or 5 should not be populated when Q2 is No        Assessment and Plan: This patient is a 28 year old nonbinary individual with a long history of depression hypomanic symptoms and ADD.  Their mood has been improved on Abilify increased to 7.5 mg daily so this will be continued.  They will also continue Intuniv 1 mg daily for ADD along with Vyvanse 20 mg every morning for ADD.  They will return to see me in 2 months  Collaboration of Care: Collaboration of Care: Primary Care Provider AEB notes are shared with PCP on the epic system  Patient/Guardian was advised Release of Information must be obtained prior to any record release in order to collaborate their care with an outside provider. Patient/Guardian was advised if they have not already done so to contact the registration department to sign all necessary forms in order for [place] to release information regarding their care.   Consent: Patient/Guardian gives verbal consent for treatment and assignment of benefits for services provided during this visit. Patient/Guardian expressed understanding and agreed to proceed.     Janet Ruder, MD 10/20/2023, 11:33 AM

## 2023-10-22 ENCOUNTER — Ambulatory Visit (INDEPENDENT_AMBULATORY_CARE_PROVIDER_SITE_OTHER): Payer: BC Managed Care – PPO | Admitting: Family Medicine

## 2023-10-22 ENCOUNTER — Encounter: Payer: Self-pay | Admitting: Family Medicine

## 2023-10-22 VITALS — BP 126/74 | HR 80 | Temp 97.8°F | Ht 64.0 in | Wt 250.0 lb

## 2023-10-22 DIAGNOSIS — E559 Vitamin D deficiency, unspecified: Secondary | ICD-10-CM

## 2023-10-22 DIAGNOSIS — E282 Polycystic ovarian syndrome: Secondary | ICD-10-CM

## 2023-10-22 DIAGNOSIS — R7303 Prediabetes: Secondary | ICD-10-CM | POA: Diagnosis not present

## 2023-10-22 NOTE — Patient Instructions (Signed)
Start taking vitamin D3   Continue metformin

## 2023-10-22 NOTE — Progress Notes (Unsigned)
Subjective:     Patient ID: Janet Palmer, adult    DOB: 1996-04-26, 26 y.o.   MRN: [id number]  Chief Complaint  Patient presents with   Medical Management of Chronic Issues    3 month f/u    HPI   History of Present Illness         She is here for a follow up. Restarted metformin at our last visit in September 2024. She has established care with [facility]. Sees her therapist and psychiatrist regularly. Reports her mood is good and she is feeling fine overall.   Dr. Tyson Alias is her OB/GYN.   OB/GYN prescribed her Provera to induce a period after 3 months.       Health Maintenance Due  Topic Date Due   HPV VACCINES (2 - 2-dose series) 03/28/2012   HIV Screening  Never done   Hepatitis C Screening  Never done   Cervical Cancer Screening (Pap smear)  Never done   COVID-19 Vaccine (3 - Pfizer risk series) 02/13/2020   DTaP/Tdap/Td (2 - Td or Tdap) 09/28/2020    Past Medical History:  Diagnosis Date   ADHD    Allergy    Anxiety    B12 deficiency    Back pain    Bipolar 2 disorder (HCC)    Constipation    Depression    Eating disorder    Fibromyalgia    Heartburn    Joint pain    OCD (obsessive compulsive disorder)    PCOS (polycystic ovarian syndrome)    Prediabetes    PTSD (post-traumatic stress disorder)    Vitamin D deficiency     Past Surgical History:  Procedure Laterality Date   arm fracture Left 2006   FRACTURE SURGERY     WISDOM TOOTH EXTRACTION  2017    Family History  Problem Relation Age of Onset   Diabetes Mother    Depression Mother    Alcohol abuse Mother    Anxiety disorder Mother    Obesity Mother    Anxiety disorder Father    Thyroid disease Father    Hypertension Father    Hyperlipidemia Father    Stroke Father    Sleep apnea Father    OCD Brother    Depression Brother    Cancer Maternal Grandfather        pancreatic   Hypertension Maternal Grandfather    Diabetes Maternal Grandmother    Cancer Maternal Grandmother         lung with METS   Depression Maternal Grandmother     Social History   Socioeconomic History   Marital status: Single    Spouse name: Not on file   Number of children: Not on file   Years of education: Not on file   Highest education level: Bachelor's degree (e.g., BA, AB, BS)  Occupational History   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[employer]  Tobacco Use   Smoking status: Never   Smokeless tobacco: Never  Vaping Use   Vaping status: Never Used  Substance and Sexual Activity   Alcohol use: Not Currently   Drug use: Never   Sexual activity: Not Currently    Partners: Female, Female    Birth control/protection: Other-see comments    Comment: Lack of penetrative sex  Other Topics Concern   Not on file  Social History Narrative   Not on file   Social Drivers of Health   Financial Resource Strain: Medium Risk (10/19/2023)   Overall Physicist, medical Strain (  CARDIA)    Difficulty of Paying Living Expenses: Somewhat hard  Food Insecurity: No Food Insecurity (10/19/2023)   Hunger Vital Sign    Worried About Running Out of Food in the Last Year: Never true    Ran Out of Food in the Last Year: Never true  Transportation Needs: No Transportation Needs (10/19/2023)   PRAPARE - Administrator, Civil Service (Medical): No    Lack of Transportation (Non-Medical): No  Physical Activity: Insufficiently Active (10/19/2023)   Exercise Vital Sign    Days of Exercise per Week: 2 days    Minutes of Exercise per Session: 20 min  Stress: Stress Concern Present (10/19/2023)   [employer] of Occupational Health - Occupational Stress Questionnaire    Feeling of Stress : Rather much  Social Connections: Socially Isolated (10/19/2023)   Social Connection and Isolation Panel [NHANES]    Frequency of Communication with Friends and Family: Three times a week    Frequency of Social Gatherings with Friends and Family: Twice a week    Attends Religious Services: Never     Database administrator or Organizations: No    Attends Engineer, structural: Not on file    Marital Status: Never married  Intimate Partner Violence: Not on file    Outpatient Medications Prior to Visit  Medication Sig Dispense Refill   ARIPiprazole (ABILIFY) 5 MG tablet Take 1.5 tablets (7.5 mg total) by mouth daily. 45 tablet 0   Cholecalciferol (VITAMIN D3) 125 MCG (5000 UT) CAPS Take 1 capsule (5,000 Units total) by mouth daily.     diazepam (VALIUM) 5 MG tablet      guanFACINE (INTUNIV) 1 MG TB24 ER tablet Take 1 tablet (1 mg total) by mouth daily. 30 tablet 2   hydrOXYzine (ATARAX) 10 MG tablet Take by mouth.     lisdexamfetamine (VYVANSE) 20 MG capsule Take 1 capsule (20 mg total) by mouth daily. 30 capsule 0   lisdexamfetamine (VYVANSE) 20 MG capsule Take 1 capsule (20 mg total) by mouth daily. 30 capsule 0   medroxyPROGESTERone (PROVERA) 10 MG tablet If no period > 90 days, take one tablet daily x 10 days     metFORMIN (GLUCOPHAGE-XR) 500 MG 24 hr tablet TAKE 1 TABLET BY MOUTH EVERY DAY WITH BREAKFAST 90 tablet 0   nortriptyline (PAMELOR) 10 MG capsule      No facility-administered medications prior to visit.    Allergies  Allergen Reactions   Amoxicillin    Lamictal [Lamotrigine] Dermatitis    Levonne Spiller syndrome   Motrin [Ibuprofen]    Other Other (See Comments)    Birth Control causes suicidal tendencies   Soy Allergy (Do Not Select) Nausea And Vomiting    Other Reaction(s): GI Intolerance    Review of Systems  Constitutional:  Negative for chills and fever.  Respiratory:  Negative for shortness of breath.   Cardiovascular:  Negative for chest pain and palpitations.  Gastrointestinal:  Negative for abdominal pain, constipation, diarrhea, nausea and vomiting.  Neurological:  Negative for dizziness and focal weakness.  Psychiatric/Behavioral:  Negative for depression. The patient is not nervous/anxious.        Objective:    Physical  Exam Constitutional:      General: MJ is not in acute distress.    Appearance: MJ is not ill-appearing.  Eyes:     Extraocular Movements: Extraocular movements intact.     Conjunctiva/sclera: Conjunctivae normal.  Cardiovascular:     Rate and Rhythm:  Normal rate.  Pulmonary:     Effort: Pulmonary effort is normal.  Musculoskeletal:     Cervical back: Normal range of motion and neck supple.  Skin:    General: Skin is warm and dry.  Neurological:     General: No focal deficit present.     Mental Status: MJ is alert and oriented to person, place, and time.  Psychiatric:        Mood and Affect: Mood normal.        Behavior: Behavior normal.        Thought Content: Thought content normal.      BP 126/74 (BP Location: Left Arm, Patient Position: Sitting, Cuff Size: Large)   Pulse 80   Temp 97.8 F (36.6 C) (Temporal)   Ht 5\' 4"  (1.626 m)   Wt 250 lb (113.4 kg)   SpO2 98%   BMI 42.91 kg/m  Wt Readings from Last 3 Encounters:  10/22/23 250 lb (113.4 kg)  10/13/23 243 lb (110.2 kg)  09/07/23 244 lb (110.7 kg)       Assessment & Plan:   Problem List Items Addressed This Visit     Polycystic ovarian syndrome   Prediabetes - Primary   Vitamin D deficiency   Reviewed labs from her previous visit with her. Low vitamin D. Discussed starting a vitamin D supplement.  Continue metformin and follow up with specialists.  Follow up her in 6 months or sooner if needed.    I am having Khylie Patchin "MJ" maintain MJ's hydrOXYzine, diazepam, medroxyPROGESTERone, nortriptyline, Vitamin D3, metFORMIN, ARIPiprazole, lisdexamfetamine, guanFACINE, and lisdexamfetamine.  No orders of the defined types were placed in this encounter.

## 2023-10-26 NOTE — Telephone Encounter (Signed)
FYI

## 2023-11-01 ENCOUNTER — Telehealth (INDEPENDENT_AMBULATORY_CARE_PROVIDER_SITE_OTHER): Payer: BC Managed Care – PPO | Admitting: Psychology

## 2023-11-02 DIAGNOSIS — F3181 Bipolar II disorder: Secondary | ICD-10-CM | POA: Diagnosis not present

## 2023-11-02 NOTE — Assessment & Plan Note (Signed)
 Patient and I discussed at length today her OCD and how easy it could be for her to fall back into an obsessive state in terms of monitoring food intake calories and nutrition.  She mentions that she does sometimes worry she is going to fall back into that because she rationalizes things in her brain or even feels poorly if she makes certain food decisions in terms of content or quantity.  We discussed the importance of eating and opting to have something over nothing regardless of what the choices.  We discussed following up via MyChart messages to ensure patient is getting in 1 meal or snacking throughout the day to ensure she is getting consistent intake of calories.  Will follow-up at next appointment to see where patient's mindset is in terms of her food intake.  She feels confident in starting psychotherapy with Dr. Sharron to address her emotional eating and her relationship with food.

## 2023-11-04 ENCOUNTER — Ambulatory Visit (INDEPENDENT_AMBULATORY_CARE_PROVIDER_SITE_OTHER): Payer: BC Managed Care – PPO | Admitting: Family Medicine

## 2023-11-04 ENCOUNTER — Encounter (INDEPENDENT_AMBULATORY_CARE_PROVIDER_SITE_OTHER): Payer: Self-pay | Admitting: Family Medicine

## 2023-11-04 DIAGNOSIS — F5084 Rumination disorder in adults: Secondary | ICD-10-CM

## 2023-11-04 DIAGNOSIS — F5089 Other specified eating disorder: Secondary | ICD-10-CM | POA: Diagnosis not present

## 2023-11-04 DIAGNOSIS — Z6841 Body Mass Index (BMI) 40.0 and over, adult: Secondary | ICD-10-CM | POA: Diagnosis not present

## 2023-11-04 NOTE — Progress Notes (Signed)
   SUBJECTIVE:  Chief Complaint: Obesity  Interim History: Over the last month patient found a resume editor to help her create a more attractive CV for potential jobs. She does feel like she has been she's been fluctuating between under and over eating.  She has tried to think of ways to work on consistency to following initial plan.  Her brother's birthday is in 3 days and its his 21st birthday.  She does not have any other plans coming up.   Janet Palmer is here to discuss Janet Palmer's progress with Janet Palmer's obesity treatment plan. Janet Palmer is on the practicing portion control and making smarter food choices, such as increasing vegetables and decreasing simple carbohydrates and states Janet Palmer is following Janet Palmer's eating plan approximately 40 % of the time. Janet Palmer states Janet Palmer is taking some walks.   OBJECTIVE: Visit Diagnoses: Problem List Items Addressed This Visit       Other   Severe obesity (BMI >= 40) (HCC)   Starting weight: 245 Peak weight: 245 BMR: 1685 Previous obesity management: none Body Fat %: 45.6% Starting Meal Plan: Category 3 Meal Plan needs: does not want to know weight change  Patient working on consistency of food intake and trying to learn about increasing her nutrition daily.  We are making slow changes in order to not trigger a more restrictive eating disorder.       Eating disorder   Patient has been trying to examine her relationship with food.  Working on feelings of guilt, shame, embarassment after eating less nutritious foods.  Goal is to eat 2x a day and try to eat the same things for 3 days at a time to ensure more consistent intake.      Other Visit Diagnoses       Obesity with starting BMI of 42.2    -  Primary     BMI 40.0-44.9, adult (HCC)           No data recorded  No data recorded  No data recorded  No data recorded    ASSESSMENT AND PLAN:  Diet: Janet Palmer is currently in the action stage of change. As such, Janet Palmer's goal is to continue with weight loss efforts. Janet Palmer  has agreed to practicing portion control and making smarter food choices, such as increasing vegetables and decreasing simple carbohydrates.  Exercise: Janet Palmer has been instructed that some exercise is better than none for weight loss and overall health benefits.   Behavior Modification:  We discussed the following Behavioral Modification Strategies today: increasing lean protein intake, increasing vegetables, meal planning and cooking strategies, and emotional eating strategies .   No follow-ups on file.. Janet Palmer was informed of the importance of frequent follow up visits to maximize Janet Palmer's success with intensive lifestyle modifications for Janet Palmer's multiple health conditions.  Attestation Statements:   Reviewed by clinician on day of visit: allergies, medications, problem list, medical history, surgical history, family history, social history, and previous encounter notes.    Janet Cho, MD

## 2023-11-09 DIAGNOSIS — F509 Eating disorder, unspecified: Secondary | ICD-10-CM | POA: Insufficient documentation

## 2023-11-09 NOTE — Assessment & Plan Note (Signed)
Patient has been trying to examine her relationship with food.  Working on feelings of guilt, shame, embarassment after eating less nutritious foods.  Goal is to eat 2x a day and try to eat the same things for 3 days at a time to ensure more consistent intake.

## 2023-11-09 NOTE — Assessment & Plan Note (Signed)
Starting weight: 245 Peak weight: 245 BMR: 1685 Previous obesity management: none Body Fat %: 45.6% Starting Meal Plan: Category 3 Meal Plan needs: does not want to know weight change  Patient working on consistency of food intake and trying to learn about increasing her nutrition daily.  We are making slow changes in order to not trigger a more restrictive eating disorder.

## 2023-11-13 ENCOUNTER — Other Ambulatory Visit (HOSPITAL_COMMUNITY): Payer: Self-pay | Admitting: Psychiatry

## 2023-11-16 DIAGNOSIS — F3181 Bipolar II disorder: Secondary | ICD-10-CM | POA: Diagnosis not present

## 2023-11-22 ENCOUNTER — Telehealth (INDEPENDENT_AMBULATORY_CARE_PROVIDER_SITE_OTHER): Payer: BC Managed Care – PPO | Admitting: Psychology

## 2023-11-22 DIAGNOSIS — F5089 Other specified eating disorder: Secondary | ICD-10-CM

## 2023-11-22 DIAGNOSIS — F429 Obsessive-compulsive disorder, unspecified: Secondary | ICD-10-CM | POA: Diagnosis not present

## 2023-11-22 DIAGNOSIS — F909 Attention-deficit hyperactivity disorder, unspecified type: Secondary | ICD-10-CM | POA: Diagnosis not present

## 2023-11-22 DIAGNOSIS — F319 Bipolar disorder, unspecified: Secondary | ICD-10-CM | POA: Diagnosis not present

## 2023-11-22 NOTE — Progress Notes (Signed)
 Office: [phone]  /  Fax: [phone]    Date: November 22, 2023    Appointment Start Time: 9:01am Duration: 60 minutes Provider: Lawerance Cruel, Psy.D. Type of Session: Intake for Individual Therapy  Location of Patient: Home (private location) Location of Provider: Provider's home (private office) Type of Contact: Telepsychological Visit via MyChart Video Visit  Informed Consent: Prior to proceeding with today's appointment, two pieces of identifying information were obtained. In addition, Inda's physical location at the time of this appointment was obtained as well a phone number MJ could be reached at in the event of technical difficulties. Lorrine and this provider participated in today's telepsychological service.   The provider's role was explained to [facility]. The provider reviewed and discussed issues of confidentiality, privacy, and limits therein (e.g., reporting obligations). In addition to verbal informed consent, written informed consent for psychological services was obtained prior to the initial appointment. Since the clinic is not a 24/7 crisis center, mental health emergency resources were shared and this  provider explained MyChart, e-mail, voicemail, and/or other messaging systems should be utilized only for non-emergency reasons. This provider also explained that information obtained during appointments will be placed in [facility] medical record and relevant information will be shared with other providers at [facility] at any locations for coordination of care. Ronnell agreed information may be shared with other [facility] providers as needed for coordination of care and by signing the service agreement document, MJ provided written consent for coordination of care. Prior to initiating telepsychological services, Maudean completed an informed consent document, which included the development of a safety plan (i.e., an emergency contact and  emergency resources) in the event of an emergency/crisis. Liyat verbally acknowledged understanding MJ is ultimately responsible for understanding MJ's insurance benefits for telepsychological and in-person services. This provider also reviewed confidentiality, as it relates to telepsychological services. Armanie  acknowledged understanding that appointments cannot be recorded without both party consent and MJ is aware MJ is responsible for securing confidentiality on MJ's end of the session. Monay verbally consented to proceed.  Chief Complaint/HPI: Aviella was referred by Dr. Reuben Likes on 10/13/2023. Per the note for the OV, "Patient and I discussed at length today her OCD and how easy it could be for her to fall back into an obsessive state in terms of monitoring food intake calories and nutrition.  She mentions that she does sometimes worry she is going to fall back into that because she rationalizes things in her brain or even feels poorly if she makes certain food decisions in terms of content or quantity.  We discussed the importance of eating and opting to have something over nothing regardless of what the choices.  We discussed following up via MyChart messages to ensure patient is getting in 1 meal or snacking throughout the day to ensure she is getting consistent intake of calories.  Will follow-up at next appointment to see where patient's mindset is in terms of her food intake.  She feels confident in starting psychotherapy with Dr. Dewaine Conger to address her emotional eating and her relationship with food."   During today's appointment, Alison Stalling discussed a "lifelong messed up relationship with food that [she doesn't] how to combat." Currently, she reported experiencing "a lack of accountability" on her end to "keep up with the eating plan." She also reported experiencing obsessive thoughts related to food. She was verbally administered a questionnaire assessing various behaviors related to  emotional eating behaviors. Amberli endorsed the following:  overeat when you are celebrating, experience food cravings on a regular basis, eat certain foods when you are anxious, stressed, depressed, or your feelings are hurt, use food to help you cope with emotional situations, find food is comforting to you, overeat when you are angry or upset, overeat when you are worried about something, overeat frequently when you are bored or lonely, not worry about what you eat when you are in a good mood, and eat as a reward. MJ shared MJ craves "sugar and carbs." Aura believes the onset of emotional eating behaviors was likely in childhood and described the current frequency of emotional eating behaviors as "daily." In addition, Kasie endorsed a history of binge eating behaviors, noting she last experienced a binge eating episode approximately one year ago. Mackinzee reported a history of significantly restricting food intake, noting the last time was one month ago for approximately two days. She recalled she began calorie counting at age [age] as she did not like her body-type and her mother did not like her own body-type either. She denied a history of purging and engagement in other compensatory strategies for weight loss, and has never been diagnosed with an eating disorder. MJ also denied a history of treatment for emotional or binge eating behaviors. Furthermore, Viviann described her employment as "very stressful."  Mental Status Examination:  Appearance: neat Behavior: appropriate to circumstances Mood: neutral Affect: mood congruent Speech: WNL Eye Contact: appropriate Psychomotor Activity: WNL Gait: unable to assess  Thought Process: linear, logical, and goal directed and denies suicidal, homicidal, and self-harm ideation, plan and intent  Thought Content/Perception: no hallucinations, delusions, bizarre thinking or behavior endorsed or observed Orientation: AAOx4 Memory/Concentration:  intact Insight/Judgment: fair  Family & Psychosocial History: Kensey reported MJ is in a relationship and does not have any children. 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 Additionally, Sundus shared MJ's highest level of education obtained is a bachelor's degree. Currently, Romelle's social support system consists of MJ's partner, couple friends, mother, step-father, father, step-mother, and brother. Moreover, Chaniah stated MJ resides with MJ's cat (Scout).   Medical History:  Past Medical History:  Diagnosis Date   ADHD    Allergy    Anxiety    B12 deficiency    Back pain    Bipolar 2 disorder (HCC)    Constipation    Depression    Eating disorder    Fibromyalgia    Heartburn    Joint pain    OCD (obsessive compulsive disorder)    PCOS (polycystic ovarian syndrome)    Prediabetes    PTSD (post-traumatic stress disorder)    Vitamin D deficiency    Past Surgical History:  Procedure Laterality Date   arm fracture Left 2006   FRACTURE SURGERY     WISDOM TOOTH EXTRACTION  2017   Current Outpatient Medications on File Prior to Visit  Medication Sig Dispense Refill   ARIPiprazole (ABILIFY) 5 MG tablet TAKE 1.5 TABLETS BY MOUTH DAILY. 135 tablet 1   Cholecalciferol (VITAMIN D3) 125 MCG (5000 UT) CAPS Take 1 capsule (5,000 Units total) by mouth daily.     diazepam (VALIUM) 5 MG tablet      guanFACINE (INTUNIV) 1 MG TB24 ER tablet Take 1 tablet (1 mg total) by mouth daily. 30 tablet 2   hydrOXYzine (ATARAX) 10 MG tablet Take by mouth.     lisdexamfetamine (VYVANSE) 20 MG capsule Take 1 capsule (20 mg total) by mouth daily. 30 capsule  0   medroxyPROGESTERone (PROVERA) 10 MG tablet If no period > 90 days, take one tablet daily x 10 days     metFORMIN (GLUCOPHAGE-XR) 500 MG 24 hr tablet TAKE 1 TABLET BY MOUTH EVERY DAY WITH BREAKFAST 90 tablet 0   nortriptyline (PAMELOR) 10 MG capsule      No current facility-administered medications  on file prior to visit.  Adamarys stated MJ is medication compliant aside from nortriptyline, as she feels anxious starting a new medication. She was encouraged to discuss with her prescribing provider.   Mental Health History: Alfhild reported a history of therapeutic services starting around age [age] prior to her parent's divorce, noting she was diagnosed with OCD and Bipolar II at age 26 and ADHD approximately 2.5 years ago. MJ reported currently meeting with Yates Decamp, MS [facility] with [facility] in the [place], but stated she is seeking a new provider. She shared the focus of treatment is "general mental and emotional well-being." They meet once every two weeks. Orena agreed to sign an authorization for coordination of care if deemed necessary. MJ reported current psychotropic medications (Abilify, Vyvanse, Intuniv, and Atarax PRN) are prescribed by [name], MD. She described her medications as helpful. Kyarah reported there is no history of hospitalizations for psychiatric concerns. Sarrah endorsed a family history of alcohol abuse (mother), depression (mother), anxiety (mother and father), OCD (mother and brother), and tobacco use (mother and maternal grandparents). Furthermore, Chasidy disclosed her father was "not an especially good parent" during MJ's childhood, adding they are close now. She also recalled being bullied during school and her mother's alcohol abuse also impacted MJ's overall well-being.   MJ reported a history of experiencing suicidal ideation "on and off" since age 49. She reported an attempt at age 33, noting she has not attempted since. MJ reported she last experienced suicidal ideation "one to two months" ago. She last experienced suicidal plan and intent during her teenage years. She also reported engaging in cutting behaviors, noting the last time was around age 72. MJ reported she recently engaged in self-harm via "hitting" and "slapping" self against  objects, noting the last time was a week ago. She denied a history of needing medical attention for the aforementioned. The following protective factors were identified for Carmie: cat; partner; friends; writing a book; and keeping up with films/movies. If MJ were to become overwhelmed in the future, which is a sign that a crisis may occur, MJ identified the following coping skills MJ could engage in: writing; spending time with cat; calling a friend; and watching a movie. It was recommended the aforementioned be written down and developed into a coping card for future reference; MJ was observed writing. Psychoeducation regarding the importance of reaching out to a trusted individual and/or utilizing emergency resources if there is a change in emotional status and/or there is an inability to ensure safety was provided. Mayreli's confidence in reaching out to a trusted individual and/or utilizing emergency resources should there be an intensification in emotional status and/or there is an inability to ensure safety was assessed on a scale of one to ten where one is not confident and ten is extremely confident. MJ reported MJ's confidence is a 10. Additionally, Armando denied current access to firearms and/or weapons.   Dharma described MJ's typical mood lately as "up and down." MJ endorsed a history of hypomania symptoms (i.e., decreased impulse control; "eating whatever;" excessive spending; and restlessness) every two months and the symptoms last for a couple  of days. MJ also discussed a history of panic attacks and described the frequency as once a month, noting the trigger is work. Itha denied current alcohol use. MJ denied tobacco use. MJ denied illicit/recreational substance use. Furthermore, Elani indicated MJ is not experiencing the following: hallucinations and delusions, paranoia, social withdrawal, crying spells, and symptoms of trauma. MJ also denied current suicidal ideation, plan, and intent; history  of and current homicidal ideation, plan, and intent; and current ideation and engagement in self-harm.  Legal History: Kenyotta reported there is no history of legal involvement.   Structured Assessments Results: The Patient Health Questionnaire-9 (PHQ-9) is a self-report measure that assesses symptoms and severity of depression over the course of the last two weeks. Kaysha obtained a score of 18 suggesting moderately severe depression. Zareena finds the endorsed symptoms to be very difficult. [0= Not at all; 1= Several days; 2= More than half the days; 3= Nearly every day] Little interest or pleasure in doing things 1  Feeling down, depressed, or hopeless 1  Trouble falling or staying asleep, or sleeping too much 3  Feeling tired or having little energy 3  Poor appetite or overeating-fluctuates 3  Feeling bad about yourself --- or that you are a failure or have let yourself or your family down 3  Trouble concentrating on things, such as reading the newspaper or watching television 3  Moving or speaking so slowly that other people could have noticed? Or the opposite --- being so fidgety or restless that you have been moving around a lot more than usual 1  Thoughts that you would be better off dead or hurting yourself in some way 0  PHQ-9 Score 18    The Generalized Anxiety Disorder-7 (GAD-7) is a brief self-report measure that assesses symptoms of anxiety over the course of the last two weeks. Crystale obtained a score of 7 suggesting mild anxiety. Ruthene finds the endorsed symptoms to be somewhat difficult. [0= Not at all; 1= Several days; 2= Over half the days; 3= Nearly every day] Feeling nervous, anxious, on edge 1  Not being able to stop or control worrying 1  Worrying too much about different things 1  Trouble relaxing 1  Being so restless that it's hard to sit still 2  Becoming easily annoyed or irritable 1  Feeling afraid as if something awful might happen 0  GAD-7 Score 7    Interventions:  Conducted a chart review Focused on rapport building Verbally administered PHQ-9 and GAD-7 for symptom monitoring Verbally administered Food & Mood questionnaire to assess various behaviors related to emotional eating Provided emphatic reflections and validation Psychoeducation provided regarding physical versus emotional hunger Conducted a risk assessment Developed a coping card Provided positive reinforcement  Diagnostic Impressions & Provisional DSM-5 Diagnosis(es): MJ reported a history of emotional and binge eating behaviors. MJ believes the onset of emotional eating behaviors was likely in childhood and described the current frequency of emotional eating behaviors as "daily." MJ last engaged in binge eating behaviors approximately one year ago. Additionally, MJ reported a history of restricting food intake with the last instance being approximately one month ago. MJ denied engagement in any other disordered eating behaviors at this time. As such, the following diagnosis was assigned: F50.89 Other Specified Feeding or Eating Disorder, Emotional Eating Behaviors. MJ also shared previous diagnoses of ADHD, OCD, and Bipolar II disorder. Given the limited scope of this appointment and this provider's role with the clinic, the following diagnoses were assigned: F90.9 Unspecified Attention-Deficit/Hyperactivity Disorder,  and 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 9 Unspecified Bipolar and Related Disorder.  Plan: Leylanie appears able and willing to participate as evidenced by engagement in reciprocal conversation and asking questions as needed for clarification. The next appointment is scheduled for 12/07/2023 at 12:30pm, which will be via MyChart Video Visit. The following treatment goal was established: increase coping skills. This provider will regularly review the treatment plan and medical chart to keep informed of status changes. Derenda expressed  understanding and agreement with the initial treatment plan of care. Machel will be sent a handout via e-mail to utilize between now and the next appointment to increase awareness of hunger patterns and subsequent eating. Maxene provided verbal consent during today's appointment for this provider to send the handout via e-mail. Keyia provided verbal consent for this provider to e-mail referral options.    Lawerance Cruel, PsyD

## 2023-12-01 ENCOUNTER — Ambulatory Visit (INDEPENDENT_AMBULATORY_CARE_PROVIDER_SITE_OTHER): Payer: BC Managed Care – PPO | Admitting: Family Medicine

## 2023-12-06 DIAGNOSIS — F902 Attention-deficit hyperactivity disorder, combined type: Secondary | ICD-10-CM | POA: Diagnosis not present

## 2023-12-06 DIAGNOSIS — F422 Mixed obsessional thoughts and acts: Secondary | ICD-10-CM | POA: Diagnosis not present

## 2023-12-06 DIAGNOSIS — F3181 Bipolar II disorder: Secondary | ICD-10-CM | POA: Diagnosis not present

## 2023-12-07 ENCOUNTER — Telehealth (INDEPENDENT_AMBULATORY_CARE_PROVIDER_SITE_OTHER): Payer: BC Managed Care – PPO | Admitting: Psychology

## 2023-12-16 ENCOUNTER — Ambulatory Visit (INDEPENDENT_AMBULATORY_CARE_PROVIDER_SITE_OTHER): Admitting: Family Medicine

## 2023-12-16 ENCOUNTER — Encounter (INDEPENDENT_AMBULATORY_CARE_PROVIDER_SITE_OTHER): Payer: Self-pay | Admitting: Family Medicine

## 2023-12-16 VITALS — BP 114/77 | HR 77 | Temp 97.9°F | Ht 64.0 in | Wt 242.0 lb

## 2023-12-16 DIAGNOSIS — F5084 Rumination disorder in adults: Secondary | ICD-10-CM | POA: Diagnosis not present

## 2023-12-16 DIAGNOSIS — R7303 Prediabetes: Secondary | ICD-10-CM

## 2023-12-16 DIAGNOSIS — Z6841 Body Mass Index (BMI) 40.0 and over, adult: Secondary | ICD-10-CM

## 2023-12-16 NOTE — Progress Notes (Signed)
 SUBJECTIVE:  Chief Complaint: Obesity  Interim History: Since last appointment patient lost her job.  This is both a relief and a stressor. She interviewed for another job last week and she has been applying for 2-3 jobs a day but is limited in what is available.  Has fallen into emotional eating since losing her job due to not having the additional coping mechanism that brings similar comfort. She is getting nutritious food in during the day and is eating a higher protein muffin in daily.  She also found a protein shake she is enjoying.  She is trying to eat more vegetables and eggs.  Trying to make healthier more nutritious decisions.   Tiffanye is here to discuss MJ's progress with MJ's obesity treatment plan. MJ is on the practicing portion control and making smarter food choices, such as increasing vegetables and decreasing simple carbohydrates and states MJ is following MJ's eating plan approximately 25 % of the time. MJ states MJ is walking 20 minutes 3-4 times per week.   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  We discussed the importance of starting to recognize this as a coping mechanism as well as mentally acknowledging what she is trying to cope with.  Patient, when she is not in such a stressful state, we will begin to think of other coping mechanisms she can start working on.        Other   Severe obesity (BMI >= 40) (HCC)   Anthropometric Measurements Height: 5\' 4"  (1.626 m) Weight: 242 lb (109.8 kg) BMI (Calculated): 41.52 Weight at Last Visit: 240 lb Weight Lost Since Last Visit: 0 Weight Gained Since Last Visit: 2 Starting Weight: 245 lb Total Weight Loss (lbs): 3 lb (1.361 kg) Body Composition  Body Fat %: 45.9  % Fat Mass (lbs): 111.4 lbs Muscle Mass (lbs): 124.8 lbs Total Body Water (lbs): 89.6 lbs Visceral Fat Rating : 11 Other Clinical Data Fasting: no Labs: no Today's Visit #: 5 Starting Date: 08/24/23 Comments: [facility]       Prediabetes   Other Visit Diagnoses       BMI 40.0-44.9, adult (HCC)    -  Primary     Obesity with starting BMI of 42.2           No data recorded    Vitals:   12/16/23 0800  BP: 114/77  Pulse: 77  Temp: 97.9 F (36.6 C)  SpO2: 98%      ASSESSMENT AND PLAN:  Diet: Kassadi is currently in the action stage of change. As such, MJ's goal is to continue with weight loss efforts and has agreed to practicing portion control and making smarter food choices, such as increasing vegetables and decreasing simple carbohydrates.   Exercise:  All adults should avoid inactivity. Some activity is better than none, and adults who participate in any amount of physical activity, gain some health benefits.  Behavior Modification:  We discussed the following Behavioral Modification Strategies today: increasing lean protein intake, meal planning and cooking strategies, emotional eating strategies , avoiding temptations, and planning for success. We discussed various medication options to help Mistie with MJ's weight loss efforts and we both agreed to contemplate GLP1 usage between now  and next appointment.  Return in about 4 weeks (around 01/13/2024) for fasting labs.. MJ was informed of the importance of frequent follow up visits to maximize MJ's success with intensive lifestyle modifications for MJ's multiple health conditions.  Attestation Statements:   Reviewed by clinician on day of visit: allergies, medications, problem list, medical history, surgical history, family history, social history, and previous encounter notes.     Reuben Likes, MD

## 2023-12-17 ENCOUNTER — Other Ambulatory Visit: Payer: Self-pay | Admitting: Family Medicine

## 2023-12-17 DIAGNOSIS — R7303 Prediabetes: Secondary | ICD-10-CM

## 2023-12-22 DIAGNOSIS — F5084 Rumination disorder in adults: Secondary | ICD-10-CM | POA: Insufficient documentation

## 2023-12-22 NOTE — Assessment & Plan Note (Signed)
 Anthropometric Measurements Height: 5\' 4"  (1.626 m) Weight: 242 lb (109.8 kg) BMI (Calculated): 41.52 Weight at Last Visit: 240 lb Weight Lost Since Last Visit: 0 Weight Gained Since Last Visit: 2 Starting Weight: 245 lb Total Weight Loss (lbs): 3 lb (1.361 kg) Body Composition  Body Fat %: 45.9 % Fat Mass (lbs): 111.4 lbs Muscle Mass (lbs): 124.8 lbs Total Body Water (lbs): 89.6 lbs Visceral Fat Rating : 11 Other Clinical Data Fasting: no Labs: no Today's Visit #: 5 Starting Date: 08/24/23 Comments: [facility]

## 2023-12-22 NOTE — Assessment & Plan Note (Signed)
 With recent stress of losing job patient has noticed she has gone into some emotional eating tendencies in which she will eat emotionally to cope with the stress.  We discussed the importance of starting to recognize this as a coping mechanism as well as mentally acknowledging what she is trying to cope with.  Patient, when she is not in such a stressful state, we will begin to think of other coping mechanisms she can start working on.

## 2023-12-23 DIAGNOSIS — H6123 Impacted cerumen, bilateral: Secondary | ICD-10-CM | POA: Diagnosis not present

## 2023-12-27 DIAGNOSIS — F902 Attention-deficit hyperactivity disorder, combined type: Secondary | ICD-10-CM | POA: Diagnosis not present

## 2023-12-27 DIAGNOSIS — F3181 Bipolar II disorder: Secondary | ICD-10-CM | POA: Diagnosis not present

## 2023-12-27 DIAGNOSIS — F422 Mixed obsessional thoughts and acts: Secondary | ICD-10-CM | POA: Diagnosis not present

## 2023-12-28 ENCOUNTER — Telehealth (INDEPENDENT_AMBULATORY_CARE_PROVIDER_SITE_OTHER): Admitting: Psychology

## 2023-12-28 DIAGNOSIS — F5089 Other specified eating disorder: Secondary | ICD-10-CM

## 2023-12-28 DIAGNOSIS — F909 Attention-deficit hyperactivity disorder, unspecified type: Secondary | ICD-10-CM

## 2023-12-28 DIAGNOSIS — F319 Bipolar disorder, unspecified: Secondary | ICD-10-CM

## 2023-12-28 DIAGNOSIS — F429 Obsessive-compulsive disorder, unspecified: Secondary | ICD-10-CM

## 2023-12-28 NOTE — Progress Notes (Signed)
  Office: [phone]  /  Fax: [phone]    Date: December 28, 2023  Appointment Start Time: 8:28am Duration: 25 minutes Provider: Lawerance Cruel, Psy.D. Type of Session: Individual Therapy  Location of Patient: Home (private location) Location of Provider: Provider's Home (private office) Type of Contact: Telepsychological Visit via MyChart Video Visit  Session Content: Janet Palmer is a 28 y.o. adult presenting for a follow-up appointment to address the previously established treatment goal of increasing coping skills.Today's appointment was a telepsychological visit. Janet Palmer provided verbal consent for today's telepsychological appointment and Janet Palmer is aware Janet Palmer is responsible for securing confidentiality on Janet Palmer's end of the session. Prior to proceeding with today's appointment, Janet Palmer's physical location at the time of this appointment was obtained as well a phone number Janet Palmer could be reached at in the event of technical difficulties. Janet Palmer and this provider participated in today's telepsychological service.   This provider conducted a brief check-in. Janet Palmer shared having "really good days and really, really bad days." She shared about recent stressors, including losing her job. Janet Palmer stated she initiated services with a new therapist and was transferred to a new provider as the initial provider is leaving the practice. A risk assessment was completed. Janet Palmer denied experiencing suicidal, self-harm and homicidal ideation, plan, and intent since the last appointment with this provider. She continues to acknowledge understanding regarding the importance of reaching out to trusted individuals and/or emergency resources if Janet Palmer is unable to ensure safety. She described future plans that include ongoing efforts to improve Janet Palmer's well-being and eating habits.   Regarding eating habits, Janet Palmer shared she will eat breakfast, lunch, and dinner. It was reflected she is going long periods without eating in between meals.  Engaged in problem solving to help increase food intake, specifically protein intake. Psychoeducation was also provided regarding mindful eating strategies (sit down, slowly chew, savor, simplify). Overall, Janet Palmer was receptive to today's appointment as evidenced by openness to sharing, responsiveness to feedback, and willingness to implement discussed strategies .  Mental Status Examination:  Appearance: neat Behavior: appropriate to circumstances Mood: neutral Affect: mood congruent Speech: WNL Eye Contact: appropriate Psychomotor Activity: WNL Gait: unable to assess Thought Process: linear, logical, and goal directed and denies suicidal, homicidal, and self-harm ideation, plan and intent  Thought Content/Perception: no hallucinations, delusions, bizarre thinking or behavior endorsed or observed Orientation: AAOx4 Memory/Concentration: intact Insight: fair Judgment: fair  Interventions:  Conducted a brief chart review Conducted a risk assessment Provided empathic reflections and validation Employed supportive psychotherapy interventions to facilitate reduced distress and to improve coping skills with identified stressors Engaged patient in problem solving Psychoeducation provided regarding mindful eating strategies  DSM-5 Diagnosis(es):  F50.89 Other Specified Feeding or Eating Disorder, Emotional Eating Behaviors, F90.9 Unspecified Attention-Deficit/Hyperactivity Disorder , 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 9 Unspecified Bipolar and Related Disorder  Treatment Goal & Progress: During the initial appointment with this provider, the following treatment goal was established: increase coping skills. Progress is limited, as Janet Palmer has just begun treatment with this provider; however, Janet Palmer is receptive to the interaction and interventions and rapport is being established.   Plan: The next appointment is scheduled for 01/18/2024 at 12pm, which will be via  MyChart Video Visit. The next session will focus on working towards the established treatment goal. Janet Palmer will continue with Janet Palmer's primary therapist.    Lawerance Cruel, PsyD

## 2024-01-03 DIAGNOSIS — F422 Mixed obsessional thoughts and acts: Secondary | ICD-10-CM | POA: Diagnosis not present

## 2024-01-03 DIAGNOSIS — F902 Attention-deficit hyperactivity disorder, combined type: Secondary | ICD-10-CM | POA: Diagnosis not present

## 2024-01-03 DIAGNOSIS — F3181 Bipolar II disorder: Secondary | ICD-10-CM | POA: Diagnosis not present

## 2024-01-17 DIAGNOSIS — F902 Attention-deficit hyperactivity disorder, combined type: Secondary | ICD-10-CM | POA: Diagnosis not present

## 2024-01-17 DIAGNOSIS — F3181 Bipolar II disorder: Secondary | ICD-10-CM | POA: Diagnosis not present

## 2024-01-17 DIAGNOSIS — F422 Mixed obsessional thoughts and acts: Secondary | ICD-10-CM | POA: Diagnosis not present

## 2024-01-18 ENCOUNTER — Telehealth (INDEPENDENT_AMBULATORY_CARE_PROVIDER_SITE_OTHER): Admitting: Psychology

## 2024-01-18 DIAGNOSIS — F319 Bipolar disorder, unspecified: Secondary | ICD-10-CM | POA: Diagnosis not present

## 2024-01-18 DIAGNOSIS — F5089 Other specified eating disorder: Secondary | ICD-10-CM

## 2024-01-18 DIAGNOSIS — F429 Obsessive-compulsive disorder, unspecified: Secondary | ICD-10-CM | POA: Diagnosis not present

## 2024-01-18 DIAGNOSIS — F909 Attention-deficit hyperactivity disorder, unspecified type: Secondary | ICD-10-CM | POA: Diagnosis not present

## 2024-01-18 NOTE — Progress Notes (Signed)
  Office: [phone]  /  Fax: [phone]    Date: January 18, 2024  Appointment Start Time: 12:01pm Duration: 24 minutes Provider: Catherene Close, Psy.D. Type of Session: Individual Therapy  Location of Patient: Home (private location) Location of Provider: Provider's Home (private office) Type of Contact: Telepsychological Visit via MyChart Video Visit  Session Content: Janet Palmer is a 28 y.o. adult presenting for a follow-up appointment to address the previously established treatment goal of increasing coping skills.Today's appointment was a telepsychological visit. Janet Palmer provided verbal consent for today's telepsychological appointment and Janet Palmer is aware Janet Palmer is responsible for securing confidentiality on Janet Palmer's end of the session. Prior to proceeding with today's appointment, Janet Palmer's physical location at the time of this appointment was obtained as well a phone number Janet Palmer could be reached at in the event of technical difficulties. Ski and this provider participated in today's telepsychological service.   This provider conducted a brief check-in. Janet Palmer shared her mother was diagnosed with cancer, adding "I've been stress eating." Associated thoughts and feelings further explored and processed. 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 Psychoeducation provided regarding self-compassion. Janet Palmer was engaged in a self-compassion exercise to help with eating-related challenges and other ongoing stressors. Janet Palmer was encouraged to regularly ask herself, "What do I need right now?" and "How can I comfort and care for myself in this moment?" Overall, Janet Palmer was receptive to today's appointment as evidenced by openness to sharing, responsiveness to feedback, and willingness to work toward increasing self-compassion.  Mental Status Examination:  Appearance: neat Behavior: appropriate to circumstances Mood: sad Affect: mood congruent; tearful at times Speech: WNL Eye Contact:  appropriate Psychomotor Activity: WNL Gait: unable to assess Thought Process: linear, logical, and goal directed and denies suicidal, homicidal, and self-harm ideation, plan and intent since the last appointment with this provider Thought Content/Perception: no hallucinations, delusions, bizarre thinking or behavior endorsed or observed Orientation: AAOx4 Memory/Concentration: intact Insight: fair Judgment: fair  Interventions:  Conducted a brief chart review Conducted a risk assessment Provided empathic reflections and validation Provided positive reinforcement Employed supportive psychotherapy interventions to facilitate reduced distress and to improve coping skills with identified stressors Psychoeducation provided regarding self-compassion Engaged pt in a self-compassion exercise  DSM-5 Diagnosis(es):  F50.89 Other Specified Feeding or Eating Disorder, Emotional Eating Behaviors, F90.9 Unspecified Attention-Deficit/Hyperactivity Disorder , 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 9 Unspecified Bipolar and Related Disorder   Treatment Goal & Progress: During the initial appointment with this provider, the following treatment goal was established: increase coping skills. Janet Palmer has demonstrated progress in Janet Palmer's goal as evidenced by increased awareness of hunger patterns.   Plan: The next appointment is scheduled for 02/08/2024 at 11am, which will be via MyChart Video Visit. The next session will focus on working towards the established treatment goal. Janet Palmer will continue with Janet Palmer's primary therapist.    Catherene Close, PsyD

## 2024-01-19 ENCOUNTER — Encounter (INDEPENDENT_AMBULATORY_CARE_PROVIDER_SITE_OTHER): Payer: Self-pay | Admitting: Family Medicine

## 2024-01-19 ENCOUNTER — Ambulatory Visit (INDEPENDENT_AMBULATORY_CARE_PROVIDER_SITE_OTHER): Admitting: Family Medicine

## 2024-01-19 VITALS — BP 118/86 | HR 81 | Temp 98.5°F | Ht 64.0 in | Wt 248.0 lb

## 2024-01-19 DIAGNOSIS — R7303 Prediabetes: Secondary | ICD-10-CM | POA: Diagnosis not present

## 2024-01-19 DIAGNOSIS — Z6841 Body Mass Index (BMI) 40.0 and over, adult: Secondary | ICD-10-CM

## 2024-01-19 DIAGNOSIS — F909 Attention-deficit hyperactivity disorder, unspecified type: Secondary | ICD-10-CM | POA: Diagnosis not present

## 2024-01-19 MED ORDER — METFORMIN HCL ER 500 MG PO TB24: 500.0000 mg | ORAL_TABLET | Freq: Two times a day (BID) | ORAL | 1 refills | Status: DC

## 2024-01-19 NOTE — Progress Notes (Signed)
 SUBJECTIVE:  Chief Complaint: Obesity  Interim History: Since last appointment patient's mother was diagnosed with pancreatic cancer- spread to lymph nodes and going to start chemo and get enrolled in clinical trials.  She is starting a new job next week at [employer].  She is significantly emotionally eating in an extreme in which she voices she has not experienced previously.   Janet Palmer is here to discuss Janet Palmer's progress with Janet Palmer's obesity treatment plan. Janet Palmer is on the practicing portion control and making smarter food choices, such as increasing vegetables and decreasing simple carbohydrates and states Janet Palmer is following Janet Palmer's eating plan approximately 25 % of the time. Janet Palmer states Janet Palmer is exercising 30-40 minutes 2 times per week.   OBJECTIVE: Visit Diagnoses: Problem List Items Addressed This Visit       Other   Attention-deficit hyperactivity disorder, unspecified type   Patient feeling symptoms are worse given mother's recent diagnosis.  Patient to reach out to psychiatry for further management.      Severe obesity (BMI >= 40) (HCC)   Anthropometric Measurements Height: 5\' 4"  (1.626 m) Weight: 248 lb (112.5 kg) BMI (Calculated): 42.55 Weight at Last Visit: 240 lb Weight Lost Since Last Visit: 0 Weight Gained Since Last Visit: 6 Starting Weight: 245 lb Total Weight Loss (lbs): 0 lb (0 kg) Body Composition  Body Fat %: 46.8 % Fat Mass (lbs): 116 lbs Muscle Mass (lbs): 125.4 lbs Total Body Water (lbs): 93 lbs Visceral Fat Rating : 12 Other Clinical Data Today's Visit #: 6 Starting Date: 08/24/23 Comments: PC/[place]       Relevant Medications   metFORMIN  (GLUCOPHAGE -XR) 500 MG 24 hr tablet   Prediabetes - Primary   Discussed metformin  usage with patient today.  Needs refill of metformin  today and will increase to twice daily administration.  No GI side effects mentioned.        Relevant Medications   metFORMIN  (GLUCOPHAGE -XR) 500 MG 24 hr tablet   Other Visit Diagnoses        BMI 40.0-44.9, adult (HCC)       Relevant Medications   metFORMIN  (GLUCOPHAGE -XR) 500 MG 24 hr tablet       No data recorded       01/19/2024   10:00 AM 01/19/2024    9:00 AM 12/16/2023    8:00 AM  Vitals with BMI  Height  5\' 4"  5\' 4"   Weight  248 lbs 242 lbs  BMI  42.55 41.52  Systolic 118 170 169  Diastolic 86 81 77  Pulse  81 77      ASSESSMENT AND PLAN:  Diet: [place] is currently in the action stage of change. As such, Janet Palmer's goal is to continue with weight loss efforts and has agreed to practicing portion control and making smarter food choices, such as increasing vegetables and decreasing simple carbohydrates.   Exercise:  All adults should avoid inactivity. Some activity is better than none, and adults who participate in any amount of physical activity, gain some health benefits.  Behavior Modification:  We discussed the following Behavioral Modification Strategies today: increasing lean protein intake, decreasing simple carbohydrates, meal planning and cooking strategies, and emotional eating strategies .   Return in about 4 weeks (around 02/16/2024).. Janet Palmer was informed of the importance of frequent follow up visits to maximize Janet Palmer's success with intensive lifestyle modifications for Janet Palmer's multiple health conditions.  Attestation Statements:   Reviewed by clinician on day of visit: allergies, medications, problem list, medical history, surgical history, family  history, social history, and previous encounter notes.     Donaciano Frizzle, MD

## 2024-01-21 ENCOUNTER — Encounter (HOSPITAL_COMMUNITY): Payer: Self-pay | Admitting: Psychiatry

## 2024-01-21 ENCOUNTER — Telehealth (INDEPENDENT_AMBULATORY_CARE_PROVIDER_SITE_OTHER): Admitting: Psychiatry

## 2024-01-21 DIAGNOSIS — F900 Attention-deficit hyperactivity disorder, predominantly inattentive type: Secondary | ICD-10-CM

## 2024-01-21 DIAGNOSIS — F3181 Bipolar II disorder: Secondary | ICD-10-CM | POA: Diagnosis not present

## 2024-01-21 MED ORDER — LISDEXAMFETAMINE DIMESYLATE 30 MG PO CAPS
30.0000 mg | ORAL_CAPSULE | ORAL | 0 refills | Status: DC
Start: 2024-01-21 — End: 2024-04-25

## 2024-01-21 MED ORDER — ARIPIPRAZOLE 5 MG PO TABS: 5.0000 mg | ORAL_TABLET | Freq: Every day | ORAL | 1 refills | Status: DC

## 2024-01-21 MED ORDER — LISDEXAMFETAMINE DIMESYLATE 30 MG PO CAPS: 30.0000 mg | ORAL_CAPSULE | ORAL | 0 refills | Status: DC

## 2024-01-21 MED ORDER — GUANFACINE HCL ER 1 MG PO TB24: 1.0000 mg | ORAL_TABLET | Freq: Every day | ORAL | 2 refills | Status: DC

## 2024-01-21 NOTE — Progress Notes (Signed)
 Virtual Visit via Video Note  I connected with Janet Palmer on 01/21/24 at 11:00 AM EDT by a video enabled telemedicine application and verified that I am speaking with the correct person using two identifiers.  Location: Patient: home Provider: office   I discussed the limitations of evaluation and management by telemedicine and the availability of in person appointments. The patient expressed understanding and agreed to proceed.      I discussed the assessment and treatment plan with the patient. The patient was provided an opportunity to ask questions and all were answered. The patient agreed with the plan and demonstrated an understanding of the instructions.   The patient was advised to call back or seek an in-person evaluation if the symptoms worsen or if the condition fails to improve as anticipated.  I provided 20 minutes of non-face-to-face time during this encounter.   Janet Annas, MD  [facility] MD/PA/NP OP Progress Note  01/21/2024 11:19 AM Janet Palmer  MRN:  [id number]  Chief Complaint:  Chief Complaint  Patient presents with   ADHD   Anxiety   Depression   Follow-up   HPI:   This patient is a 28 year old nonbinary person who lives alone in [place].  They work in patient rescheduling for psychiatric office.  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   The patient states that they have dealt with depression and hypomanic symptoms since about age 28 They attempted suicide by hanging but claims they were not really serious at that time and was just trying to get help.  They have been seeing psychiatrist and therapist ever since. They have had a long history of depressed mood punctuated by hypomanic episodes as well as anxiety.   They state that over the last couple of months his depression has become worse.  They endorse anhedonia low mood not enjoying much in their life.  Some difficulty sleeping.  Variable appetite.  They endorse thoughts of wanting to die when things get difficult  although they have never hurt themself and do not plan to.  They have also recently been diagnosed with ADD and has had a lot of trouble with focus attention span difficulty completing tasks as well as poor motivation.  They tried Strattera but with no effect.  The Intuniv  does help to some degree that they take at bedtime.  They also take Abilify  which has helped with the mood swings and depression.   The patient has tried antidepressants in the past.  The most recent 1 was Zoloft which caused hypomanic symptoms.  They are supposed to be taking nortriptyline for chronic back pain but are afraid to start it worrying about the causing of suicidal ideation.  They also tend to have OCD and ruminate and second-guess themself a good deal.  They do not use alcohol drugs cigarettes vaping.  Not currently sexually active.  They do state that the job is very stressful and do not enjoy the job.  They enjoy going to movies reading writing and going to the gym.  They are going to the [place] [facility] to work on healthier eating.  In the past they were restricting calories but never experienced bulimia  The patient was for follow-up regarding depression and ADD the after 3 months.  They state they have been under a lot more stress lately.  Their mother was recently diagnosed suddenly with pancreatic cancer.  The mother is going to moved to [place] to be nearer to the patient and her brother and will be getting chemotherapy  treatment at [facility].  The patient has been feeling worried about this but does want to be there to be supportive for their mother.  They also state that the Vyvanse  20 mg is no longer helping him concentrate and would like to go up on the dose.  The Abilify  still seems to be helping with the mood swings and irritability.  They deny any thoughts of self-harm or suicide.  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 Visit Diagnosis:    ICD-10-CM   1. Bipolar 2 disorder (HCC)   F31.81     2. Attention deficit hyperactivity disorder, inattentive type  F90.0       Past Psychiatric History: Long-term outpatient treatment. Last saw a nurse practitioner in psychiatry at [facility]  Past Medical History:  Past Medical History:  Diagnosis Date   ADHD    Allergy    Anxiety    B12 deficiency    Back pain    Bipolar 2 disorder (HCC)    Constipation    Depression    Eating disorder    Fibromyalgia    Heartburn    Joint pain    OCD (obsessive compulsive disorder)    PCOS (polycystic ovarian syndrome)    Prediabetes    PTSD (post-traumatic stress disorder)    Vitamin D  deficiency     Past Surgical History:  Procedure Laterality Date   arm fracture Left 2006   FRACTURE SURGERY     WISDOM TOOTH EXTRACTION  2017    Family Psychiatric History: See below  Family History:  Family History  Problem Relation Age of Onset   Diabetes Mother    Depression Mother    Alcohol abuse Mother    Anxiety disorder Mother    Obesity Mother    Anxiety disorder Father    Thyroid disease Father    Hypertension Father    Hyperlipidemia Father    Stroke Father    Sleep apnea Father    OCD Brother    Depression Brother    Cancer Maternal Grandfather        pancreatic   Hypertension Maternal Grandfather    Diabetes Maternal Grandmother    Cancer Maternal Grandmother        lung with METS   Depression Maternal Grandmother     Social History:  Social History   Socioeconomic History   Marital status: Single    Spouse name: Not on file   Number of children: Not on file   Years of education: Not on file   Highest education level: Bachelor's degree (e.g., BA, AB, BS)  Occupational History   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[employer]  Tobacco Use   Smoking status: Never   Smokeless tobacco: Never  Vaping Use   Vaping status: Never Used  Substance and Sexual Activity   Alcohol use: Not Currently   Drug use: Never   Sexual activity: Not  Currently    Partners: Female, Female    Birth control/protection: Other-see comments    Comment: Lack of penetrative sex  Other Topics Concern   Not on file  Social History Narrative   Not on file   Social Drivers of Health   Financial Resource Strain: Medium Risk (10/19/2023)   Overall Financial Resource Strain (CARDIA)    Difficulty of Paying Living Expenses: Somewhat hard  Food Insecurity: No Food Insecurity (10/19/2023)   Hunger Vital Sign    Worried About Running Out of Food in the  Last Year: Never true    Ran Out of Food in the Last Year: Never true  Transportation Needs: No Transportation Needs (10/19/2023)   PRAPARE - Administrator, Civil Service (Medical): No    Lack of Transportation (Non-Medical): No  Physical Activity: Insufficiently Active (10/19/2023)   Exercise Vital Sign    Days of Exercise per Week: 2 days    Minutes of Exercise per Session: 20 min  Stress: Stress Concern Present (10/19/2023)   [employer] of Occupational Health - Occupational Stress Questionnaire    Feeling of Stress : Rather much  Social Connections: Socially Isolated (10/19/2023)   Social Connection and Isolation Panel [NHANES]    Frequency of Communication with Friends and Family: Three times a week    Frequency of Social Gatherings with Friends and Family: Twice a week    Attends Religious Services: Never    Database administrator or Organizations: No    Attends Engineer, structural: Not on file    Marital Status: Never married    Allergies:  Allergies  Allergen Reactions   Amoxicillin    Lamictal [Lamotrigine] Dermatitis    Derrel Flies syndrome   Motrin [Ibuprofen]    Other Other (See Comments)    Birth Control causes suicidal tendencies   Soy Allergy (Obsolete) Nausea And Vomiting    Other Reaction(s): GI Intolerance    Metabolic Disorder Labs: Lab Results  Component Value Date   HGBA1C 6.0 (H) 08/24/2023   MPG 114 11/29/2014   Lab Results   Component Value Date   PROLACTIN 25.1 (H) 07/06/2017   PROLACTIN 21.0 11/03/2016   Lab Results  Component Value Date   CHOL 188 08/24/2023   TRIG 96 08/24/2023   HDL 46 08/24/2023   CHOLHDL 2.6 11/29/2014   VLDL 9 11/29/2014   LDLCALC 124 (H) 08/24/2023   LDLCALC 76 11/29/2014   Lab Results  Component Value Date   TSH 2.490 08/24/2023   TSH 2.450 07/06/2017    Therapeutic Level Labs: No results found for: "LITHIUM" No results found for: "VALPROATE" No results found for: "CBMZ"  Current Medications: Current Outpatient Medications  Medication Sig Dispense Refill   lisdexamfetamine (VYVANSE ) 30 MG capsule Take 1 capsule (30 mg total) by mouth every morning. 30 capsule 0   lisdexamfetamine (VYVANSE ) 30 MG capsule Take 1 capsule (30 mg total) by mouth every morning. 30 capsule 0   ARIPiprazole  (ABILIFY ) 5 MG tablet Take 1 tablet (5 mg total) by mouth daily. 90 tablet 1   Cholecalciferol (VITAMIN D3) 125 MCG (5000 UT) CAPS Take 1 capsule (5,000 Units total) by mouth daily.     diazepam (VALIUM) 5 MG tablet      guanFACINE  (INTUNIV ) 1 MG TB24 ER tablet Take 1 tablet (1 mg total) by mouth daily. 30 tablet 2   hydrOXYzine (ATARAX) 10 MG tablet Take by mouth.     medroxyPROGESTERone  (PROVERA ) 10 MG tablet If no period > 90 days, take one tablet daily x 10 days     metFORMIN  (GLUCOPHAGE -XR) 500 MG 24 hr tablet Take 1 tablet (500 mg total) by mouth 2 (two) times daily with a meal. 90 tablet 1   nortriptyline (PAMELOR) 10 MG capsule      No current facility-administered medications for this visit.     Musculoskeletal: Strength & Muscle Tone: within normal limits Gait & Station: normal Patient leans: N/A  Psychiatric Specialty Exam: Review of Systems  Psychiatric/Behavioral:  Positive for decreased concentration.  All other systems reviewed and are negative.   There were no vitals taken for this visit.There is no height or weight on file to calculate BMI.  General  Appearance: Casual and Fairly Groomed  Eye Contact:  Good  Speech:  Clear and Coherent  Volume:  Normal  Mood:  Anxious and Euthymic  Affect:  Congruent  Thought Process:  Goal Directed  Orientation:  Full (Time, Place, and Person)  Thought Content: Rumination   Suicidal Thoughts:  No  Homicidal Thoughts:  No  Memory:  Immediate;   Good Recent;   Good Remote;   Good  Judgement:  Good  Insight:  Good  Psychomotor Activity:  Decreased  Concentration:  Concentration: Fair and Attention Span: Fair  Recall:  Good  Fund of Knowledge: Good  Language: Good  Akathisia:  No  Handed:  Right  AIMS (if indicated): not done  Assets:  Communication Skills Desire for Improvement Resilience Social Support  ADL's:  Intact  Cognition: WNL  Sleep:  Good   Screenings: PHQ2-9    Flowsheet Row Video Visit [facility]/18/20[facility] in [facility] Office Visit from [facility]/02/2023 in [facility] [place]  PHQ-2 Total Score 3 3  PHQ-9 Total Score 19 20      Flowsheet Row Video Visit from 09/18/2023 in [facility]  C-SSRS RISK CATEGORY Error: Q3, 4, or 5 should not be populated when Q2 is No        Assessment and Plan: This patient is a 28 year old nonbinary individual with a long history of depression hypomanic symptoms and ADD.  They state they are not focusing as well on the Vyvanse  20 mg so this will be advanced to 30 mg every morning for ADD.  They will continue Abilify  5 mg daily for mood stabilization.  They will return to see me in 2 months  Collaboration of Care: Collaboration of Care: Primary Care Provider AEB notes are shared with PCP on the epic system  Patient/Guardian was advised Release of Information must be obtained prior to any record release in order to collaborate their care with an outside provider. Patient/Guardian was advised if they have not already done so to contact the  registration department to sign all necessary forms in order for us  to release information regarding their care.   Consent: Patient/Guardian gives verbal consent for treatment and assignment of benefits for services provided during this visit. Patient/Guardian expressed understanding and agreed to proceed.    Janet Annas, MD 01/21/2024, 11:19 AM

## 2024-01-23 NOTE — Assessment & Plan Note (Signed)
 Anthropometric Measurements Height: 5\' 4"  (1.626 m) Weight: 248 lb (112.5 kg) BMI (Calculated): 42.55 Weight at Last Visit: 240 lb Weight Lost Since Last Visit: 0 Weight Gained Since Last Visit: 6 Starting Weight: 245 lb Total Weight Loss (lbs): 0 lb (0 kg) Body Composition  Body Fat %: 46.8 % Fat Mass (lbs): 116 lbs Muscle Mass (lbs): 125.4 lbs Total Body Water (lbs): 93 lbs Visceral Fat Rating : 12 Other Clinical Data Today's Visit #: 6 Starting Date: 08/24/23 Comments: PC/Rich

## 2024-01-23 NOTE — Assessment & Plan Note (Addendum)
 Discussed metformin  usage with patient today.  Needs refill of metformin  today and will increase to twice daily administration.  No GI side effects mentioned.

## 2024-01-23 NOTE — Assessment & Plan Note (Signed)
 Patient feeling symptoms are worse given mother's recent diagnosis.  Patient to reach out to psychiatry for further management.

## 2024-02-01 DIAGNOSIS — F3181 Bipolar II disorder: Secondary | ICD-10-CM | POA: Diagnosis not present

## 2024-02-01 DIAGNOSIS — F902 Attention-deficit hyperactivity disorder, combined type: Secondary | ICD-10-CM | POA: Diagnosis not present

## 2024-02-01 DIAGNOSIS — F422 Mixed obsessional thoughts and acts: Secondary | ICD-10-CM | POA: Diagnosis not present

## 2024-02-08 ENCOUNTER — Telehealth (INDEPENDENT_AMBULATORY_CARE_PROVIDER_SITE_OTHER): Admitting: Psychology

## 2024-02-08 DIAGNOSIS — F5089 Other specified eating disorder: Secondary | ICD-10-CM

## 2024-02-08 DIAGNOSIS — F909 Attention-deficit hyperactivity disorder, unspecified type: Secondary | ICD-10-CM

## 2024-02-08 DIAGNOSIS — F429 Obsessive-compulsive disorder, unspecified: Secondary | ICD-10-CM

## 2024-02-08 DIAGNOSIS — F319 Bipolar disorder, unspecified: Secondary | ICD-10-CM | POA: Diagnosis not present

## 2024-02-08 NOTE — Progress Notes (Signed)
  Office: [phone]  /  Fax: [phone]    Date: Feb 08, 2024  Appointment Start Time: 11:18am Duration: 24 minutes Provider: Catherene Close, Psy.D. Type of Session: Individual Therapy  Location of Patient: Home (private location) Location of Provider: Provider's Home (private office) Type of Contact: Telepsychological Visit via MyChart Video Visit  Session Content: Janet Palmer is a 28 y.o. adult presenting for a follow-up appointment to address the previously established treatment goal of increasing coping skills.Today's appointment was a telepsychological visit. Mekenzie provided verbal consent for today's telepsychological appointment and MJ is aware MJ is responsible for securing confidentiality on MJ's end of the session. Prior to proceeding with today's appointment, Hang's physical location at the time of this appointment was obtained as well a phone number MJ could be reached at in the event of technical difficulties. Brienna and this provider participated in today's telepsychological service.   This provider conducted a brief check-in. Brucie indicated obtaining employment but "lost it" due to chronic pain she experienced during a shift. Of note, she stated she has an interview with [employer] tomorrow. A risk assessment was completed. Jacy denied experiencing suicidal, self-harm and homicidal ideation, plan, and intent since the last appointment with this provider. MJ continues to acknowledge understanding regarding the importance of reaching out to trusted individuals and/or emergency resources if MJ is unable to ensure safety. She also described future plans that include ongoing efforts to improve MJ's well-being and eating habits.   Regarding eating habits, Ninoshka stated engagement in stress eating. Recent eating habits were explored. It was reflected she is choosing foods out of convenience due to current stress and is also going long periods without eating. Thus, psychoeducation  provided regarding the importance of eating regularly. Engaged in problem solving. Overall, Cornellia was receptive to today's appointment as evidenced by openness to sharing, responsiveness to feedback, and willingness to implement discussed strategies .  Mental Status Examination:  Appearance: neat Behavior: appropriate to circumstances Mood: neutral Affect: mood congruent Speech: WNL Eye Contact: appropriate Psychomotor Activity: WNL Gait: unable to assess Thought Process: linear, logical, and goal directed and denies suicidal, homicidal, and self-harm ideation, plan and intent  Thought Content/Perception: no hallucinations, delusions, bizarre thinking or behavior endorsed or observed Orientation: AAOx4 Memory/Concentration: intact Insight: fair Judgment: fair  Interventions:  Conducted a brief chart review Conducted a risk assessment Provided empathic reflections and validation Employed supportive psychotherapy interventions to facilitate reduced distress and to improve coping skills with identified stressors Engaged patient in problem solving  DSM-5 Diagnosis(es): F50.89 Other Specified Feeding or Eating Disorder, Emotional Eating Behaviors, F90.9 Unspecified Attention-Deficit/Hyperactivity Disorder , 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 9 Unspecified Bipolar and Related Disorder   Treatment Goal & Progress: During the initial appointment with this provider, the following treatment goal was established: increase coping skills. Sheilla has demonstrated progress in MJ's goal as evidenced by increased awareness of hunger patterns and willingness to implement discussed strategies to improve adherence to clinic goals.   Plan: The next appointment is scheduled for 03/13/2024 at 9am, which will be via MyChart Video Visit. The next session will focus on working towards the established treatment goal. Fania will continue with MJ's primary therapist.    Catherene Close, PsyD

## 2024-02-09 ENCOUNTER — Encounter: Payer: Self-pay | Admitting: Family Medicine

## 2024-02-09 ENCOUNTER — Ambulatory Visit: Admitting: Family Medicine

## 2024-02-09 VITALS — BP 124/82 | HR 80 | Temp 97.6°F | Ht 64.0 in | Wt 250.0 lb

## 2024-02-09 DIAGNOSIS — E559 Vitamin D deficiency, unspecified: Secondary | ICD-10-CM | POA: Diagnosis not present

## 2024-02-09 DIAGNOSIS — F3181 Bipolar II disorder: Secondary | ICD-10-CM | POA: Diagnosis not present

## 2024-02-09 DIAGNOSIS — R7303 Prediabetes: Secondary | ICD-10-CM

## 2024-02-09 DIAGNOSIS — R202 Paresthesia of skin: Secondary | ICD-10-CM | POA: Diagnosis not present

## 2024-02-09 DIAGNOSIS — G894 Chronic pain syndrome: Secondary | ICD-10-CM | POA: Diagnosis not present

## 2024-02-09 DIAGNOSIS — E7800 Pure hypercholesterolemia, unspecified: Secondary | ICD-10-CM | POA: Diagnosis not present

## 2024-02-09 NOTE — Patient Instructions (Signed)
 I placed referrals to [facility] neurology and pain management.  I recommend rechecking your labs in the near future.  This can be done with your [facility] provider or follow up here for labs.

## 2024-02-09 NOTE — Progress Notes (Signed)
 Subjective:     Patient ID: Janet Palmer, adult    DOB: Sep 23, 1996, 28 y.o.   MRN: [id number]  Chief Complaint  Patient presents with   Pain    Chronic pain, wants referral to neurologist     HPI  History of Present Illness         C/o chronic pain. Nerve endings feel like they are "on fire".  States this has been an issue as long as she can remember. Pain is daily. Waxes and wanes in regards to intensity.  Daily baseline is 3/10. Shoulders, upper arms, thighs, knees, ankles and feet.   Denies numbness or weakness.    Skin feels like "TV static" in her face, elbows down and knees down to toes. If she is distracted, it is less noticeable.   States she is concerned about small fiber neuropathy.  Request referral to neurology for further evaluation.  She does not want to go back to [facility].   Reports trying medications in the past such as Cymbalta, gabapentin and nortriptyline without any improvement.   States she has been checked for lupus, autoimmune etiologies, RA, and vitamin def were ruled out except for vitamin D  deficiency.  She is taking a vitamin D  supplement   States her mother was diagnosed with pancreatic cancer. States her anxiety and depression has worsened. She denies any real intention for self harm.   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   Therapist is through [facility] mental [facility] in [place] and psychiatrist Alfredia Annas in [place]   She is seeing Janet Palmer      Health Maintenance Due  Topic Date Due   HPV VACCINES (2 - 2-dose series) 03/28/2012   HIV Screening  Never done   Hepatitis C Screening  Never done   Cervical Cancer Screening (Pap smear)  Never done   DTaP/Tdap/Td (2 - Td or Tdap) 09/28/2020    Past Medical History:  Diagnosis Date   ADHD    Allergy    Anxiety    B12 deficiency    Back pain    Bipolar 2 disorder (HCC)    Constipation     Depression    Eating disorder    Fibromyalgia    Heartburn    Joint pain    OCD (obsessive compulsive disorder)    PCOS (polycystic ovarian syndrome)    Prediabetes    PTSD (post-traumatic stress disorder)    Vitamin D  deficiency     Past Surgical History:  Procedure Laterality Date   arm fracture Left 2006   FRACTURE SURGERY     WISDOM TOOTH EXTRACTION  2017    Family History  Problem Relation Age of Onset   Diabetes Mother    Depression Mother    Alcohol abuse Mother    Anxiety disorder Mother    Obesity Mother    Anxiety disorder Father    Thyroid disease Father    Hypertension Father    Hyperlipidemia Father    Stroke Father    Sleep apnea Father    OCD Brother    Depression Brother    Cancer Maternal Grandfather        pancreatic   Hypertension Maternal Grandfather    Diabetes Maternal Grandmother    Cancer Maternal Grandmother        lung with METS   Depression Maternal Grandmother     Social History  Socioeconomic History   Marital status: Single    Spouse name: Not on file   Number of children: Not on file   Years of education: Not on file   Highest education level: Bachelor's degree (e.g., BA, AB, BS)  Occupational History   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[employer]  Tobacco Use   Smoking status: Never   Smokeless tobacco: Never  Vaping Use   Vaping status: Never Used  Substance and Sexual Activity   Alcohol use: Not Currently   Drug use: Never   Sexual activity: Not Currently    Partners: Female, Female    Birth control/protection: Other-see comments    Comment: Lack of penetrative sex  Other Topics Concern   Not on file  Social History Narrative   Not on file   Social Drivers of Health   Financial Resource Strain: Medium Risk (10/19/2023)   Overall Financial Resource Strain (CARDIA)    Difficulty of Paying Living Expenses: Somewhat hard  Food Insecurity: No Food Insecurity (10/19/2023)   Hunger Vital Sign    Worried  About Running Out of Food in the Last Year: Never true    Ran Out of Food in the Last Year: Never true  Transportation Needs: No Transportation Needs (10/19/2023)   PRAPARE - Administrator, Civil Service (Medical): No    Lack of Transportation (Non-Medical): No  Physical Activity: Insufficiently Active (10/19/2023)   Exercise Vital Sign    Days of Exercise per Week: 2 days    Minutes of Exercise per Session: 20 min  Stress: Stress Concern Present (10/19/2023)   [employer] of Occupational Health - Occupational Stress Questionnaire    Feeling of Stress : Rather much  Social Connections: Socially Isolated (10/19/2023)   Social Connection and Isolation Panel [NHANES]    Frequency of Communication with Friends and Family: Three times a week    Frequency of Social Gatherings with Friends and Family: Twice a week    Attends Religious Services: Never    Database administrator or Organizations: No    Attends Engineer, structural: Not on file    Marital Status: Never married  Intimate Partner Violence: Not on file    Outpatient Medications Prior to Visit  Medication Sig Dispense Refill   ARIPiprazole  (ABILIFY ) 5 MG tablet Take 1 tablet (5 mg total) by mouth daily. 90 tablet 1   Cholecalciferol (VITAMIN D3) 125 MCG (5000 UT) CAPS Take 1 capsule (5,000 Units total) by mouth daily.     diazepam (VALIUM) 5 MG tablet      guanFACINE  (INTUNIV ) 1 MG TB24 ER tablet Take 1 tablet (1 mg total) by mouth daily. 30 tablet 2   hydrOXYzine (ATARAX) 10 MG tablet Take by mouth.     lisdexamfetamine (VYVANSE ) 30 MG capsule Take 1 capsule (30 mg total) by mouth every morning. 30 capsule 0   medroxyPROGESTERone  (PROVERA ) 10 MG tablet If no period > 90 days, take one tablet daily x 10 days     metFORMIN  (GLUCOPHAGE -XR) 500 MG 24 hr tablet Take 1 tablet (500 mg total) by mouth 2 (two) times daily with a meal. 90 tablet 1   nortriptyline (PAMELOR) 10 MG capsule      lisdexamfetamine  (VYVANSE ) 30 MG capsule Take 1 capsule (30 mg total) by mouth every morning. 30 capsule 0   No facility-administered medications prior to visit.    Allergies  Allergen Reactions   Amoxicillin    Lamictal [Lamotrigine] Dermatitis  Derrel Flies syndrome   Motrin [Ibuprofen]    Other Other (See Comments)    Birth Control causes suicidal tendencies   Soy Allergy (Obsolete) Nausea And Vomiting    Other Reaction(s): GI Intolerance    Review of Systems  Constitutional:  Negative for chills, fever, malaise/fatigue and weight loss.  Eyes:  Negative for blurred vision and double vision.  Respiratory:  Negative for shortness of breath.   Cardiovascular:  Negative for chest pain, palpitations and leg swelling.  Gastrointestinal:  Negative for abdominal pain, constipation, diarrhea, nausea and vomiting.  Genitourinary:  Negative for dysuria, frequency and urgency.  Musculoskeletal:  Positive for back pain, joint pain and myalgias.  Neurological:  Positive for tingling. Negative for dizziness, tremors, focal weakness and headaches.  Psychiatric/Behavioral:  Positive for depression. Negative for substance abuse and suicidal ideas. The patient is nervous/anxious.        Denies intention or plan for self harm       Objective:     Physical Exam Constitutional:      General: Janet Palmer is not in acute distress.    Appearance: Janet Palmer is not ill-appearing.  HENT:     Mouth/Throat:     Mouth: Mucous membranes are moist.  Eyes:     Extraocular Movements: Extraocular movements intact.     Conjunctiva/sclera: Conjunctivae normal.  Cardiovascular:     Rate and Rhythm: Normal rate and regular rhythm.  Pulmonary:     Effort: Pulmonary effort is normal.     Breath sounds: Normal breath sounds.  Musculoskeletal:        General: No swelling.     Cervical back: Normal range of motion and neck supple.  Skin:    General: Skin is warm and dry.  Neurological:     General: No focal deficit present.      Mental Status: Janet Palmer is alert and oriented to person, place, and time.     Cranial Nerves: No cranial nerve deficit.     Motor: No weakness.     Coordination: Coordination normal.     Gait: Gait normal.  Psychiatric:        Mood and Affect: Mood normal.        Behavior: Behavior normal.        Thought Content: Thought content normal.      BP 124/82 (BP Location: Left Arm, Patient Position: Sitting)   Pulse 80   Temp 97.6 F (36.4 C) (Temporal)   Ht 5\' 4"  (1.626 m)   Wt 250 lb (113.4 kg)   SpO2 95%   BMI 42.91 kg/m  Wt Readings from Last 3 Encounters:  02/09/24 250 lb (113.4 kg)  01/19/24 248 lb (112.5 kg)  12/16/23 242 lb (109.8 kg)        Assessment & Plan:   Problem List Items Addressed This Visit     Bipolar 2 disorder (HCC)   Chronic pain   Relevant Orders   Ambulatory referral to Pain Clinic   Ambulatory referral to Neurology   Prediabetes   Severe obesity (BMI >= 40) (HCC)   Vitamin D  deficiency   Other Visit Diagnoses       Paresthesias    -  Primary   Relevant Orders   Ambulatory referral to Neurology     Pure hypercholesterolemia          Here today for chronic pain for as long as she can recall.  She was previously seeing [facility].  Reports trying multiple medications in  the past without any significant improvement.  Referral to pain management today.  She is also requesting a referral to neurology due to paresthesias of her face and throughout her body without any explanation.  She is concerned specifically regarding small fiber neuropathy.  Referral made to [facility].  Declines labs today.  Upcoming appts with Dr. Pink Bridges at [facility] and endocrinologist.  She will follow up with her psychiatrist and therapist.      I am having Chiquita Councilman "Janet Palmer" maintain Janet Palmer's hydrOXYzine, diazepam, medroxyPROGESTERone , nortriptyline, Vitamin D3, metFORMIN , lisdexamfetamine, ARIPiprazole , and guanFACINE .  No orders of the defined types were placed in this  encounter.

## 2024-02-15 ENCOUNTER — Encounter: Payer: Self-pay | Admitting: Physical Medicine and Rehabilitation

## 2024-02-15 DIAGNOSIS — F3181 Bipolar II disorder: Secondary | ICD-10-CM | POA: Diagnosis not present

## 2024-02-15 DIAGNOSIS — F422 Mixed obsessional thoughts and acts: Secondary | ICD-10-CM | POA: Diagnosis not present

## 2024-02-15 DIAGNOSIS — F902 Attention-deficit hyperactivity disorder, combined type: Secondary | ICD-10-CM | POA: Diagnosis not present

## 2024-02-29 ENCOUNTER — Ambulatory Visit (INDEPENDENT_AMBULATORY_CARE_PROVIDER_SITE_OTHER): Admitting: Family Medicine

## 2024-02-29 ENCOUNTER — Encounter (INDEPENDENT_AMBULATORY_CARE_PROVIDER_SITE_OTHER): Payer: Self-pay | Admitting: Family Medicine

## 2024-02-29 VITALS — BP 112/81 | HR 95 | Temp 98.3°F | Ht 64.0 in | Wt 245.0 lb

## 2024-02-29 DIAGNOSIS — F909 Attention-deficit hyperactivity disorder, unspecified type: Secondary | ICD-10-CM | POA: Diagnosis not present

## 2024-02-29 DIAGNOSIS — Z6841 Body Mass Index (BMI) 40.0 and over, adult: Secondary | ICD-10-CM

## 2024-02-29 MED ORDER — WEGOVY 0.25 MG/0.5ML ~~LOC~~ SOAJ: 0.2500 mg | SUBCUTANEOUS | 0 refills | Status: DC

## 2024-02-29 NOTE — Assessment & Plan Note (Signed)
 Recently off guanfacine - on a low dose and that did not have any effect on her symptoms.  She has not noticed any change in symptoms without this medication.  No other medication changes at this time.

## 2024-02-29 NOTE — Progress Notes (Signed)
 SUBJECTIVE:  Chief Complaint: Obesity  Interim History: Last follow up was about 6 weeks ago.  She got a job and she lost it due to her disability.  She just got another job and it is limited in payment.  Her application for food stamps was rejected.  She went with her mom for a checkup and helped her parents move.  Mentions life has been very crazy and stressful.  She is looking for another job for an interim period of time.  Foodwise she has been doing some stress eating and emotional not eating.  She is trying to integrate snacks into her day but occasionally forgets she has snacks but if she isn't hungry she forgets to eat.  She is struggling to stay consistent on a schedule for eating.  She talked to her grandmother about medication and is interested in Wegovy . No upcoming plans for June.   Janet Palmer is here to discuss Janet Palmer's progress with Janet Palmer's obesity treatment plan. Janet Palmer is on the practicing portion control and making smarter food choices, such as increasing vegetables and decreasing simple carbohydrates and states Janet Palmer is following Janet Palmer's eating plan approximately 30 % of the time. Janet Palmer states Janet Palmer is walking and moving.   OBJECTIVE: Visit Diagnoses: Problem List Items Addressed This Visit       Other   Attention-deficit hyperactivity disorder, unspecified type - Primary   Recently off guanfacine - on a low dose and that did not have any effect on her symptoms.  She has not noticed any change in symptoms without this medication.  No other medication changes at this time.      Severe obesity (BMI >= 40) (HCC)   Anthropometric Measurements Height: 5' 4 (1.626 m) Weight: 245 lb (111.1 kg) BMI (Calculated): 42.03 Weight at Last Visit: 248 lb Weight Lost Since Last Visit: 3 Weight Gained Since Last Visit: 0 Starting Weight: 245 lb Total Weight Loss (lbs): 0 lb (0 kg) Body Composition  Body Fat %: 49.3 % Fat Mass (lbs): 120.8 lbs Muscle Mass (lbs): 118 lbs Total Body Water (lbs): 89.8  lbs Visceral Fat Rating : 12 Other Clinical Data Today's Visit #: 7 Starting Date: 08/24/23 Comments: PC/[facility]       Relevant Medications   Semaglutide -Weight Management (WEGOVY ) 0.25 MG/0.5ML SOAJ   Other Visit Diagnoses       BMI 40.0-44.9, adult (HCC)       Relevant Medications   Semaglutide -Weight Management (WEGOVY ) 0.25 MG/0.5ML SOAJ       No data recorded       02/29/2024    9:00 AM 02/09/2024    8:46 AM 01/19/2024   10:00 AM  Vitals with BMI  Height 5' 4 5' 4   Weight 245 lbs 250 lbs   BMI 42.03 42.89   Systolic 112 124 161  Diastolic 81 82 86  Pulse 95 80       ASSESSMENT AND PLAN:  Diet: Odyssey is currently in the action stage of change. As such, Janet Palmer's goal is to continue with weight loss efforts and has agreed to practicing portion control and making smarter food choices, such as increasing vegetables and decreasing simple carbohydrates.  May want to discuss additional options like time restricted eating to help patient regain more control over intake- given patient's delicate history would likely want to start with a 10:14 options versus 8:16  Exercise:  All adults should avoid inactivity. Some activity is better than none, and adults who participate in any amount of physical activity,  gain some health benefits.  Behavior Modification:  We discussed the following Behavioral Modification Strategies today: increasing lean protein intake, decreasing simple carbohydrates, increasing vegetables, meal planning and cooking strategies, keeping healthy foods in the home, and planning for success. We discussed various medication options to help Candid with Janet Palmer's weight loss efforts and we both agreed to start Wegovy  at 0.25mg  weekly.  Return in about 4 weeks (around 03/28/2024).   Janet Palmer was informed of the importance of frequent follow up visits to maximize Janet Palmer's success with intensive lifestyle modifications for Janet Palmer's multiple health conditions.  Attestation Statements:    Reviewed by clinician on day of visit: allergies, medications, problem list, medical history, surgical history, family history, social history, and previous encounter notes.     Donaciano Frizzle, MD

## 2024-03-02 ENCOUNTER — Telehealth (INDEPENDENT_AMBULATORY_CARE_PROVIDER_SITE_OTHER): Payer: Self-pay

## 2024-03-02 NOTE — Telephone Encounter (Signed)
 I called the patient to verify insurance so I could complete a PA for the medication prescribed. The most recent card we have is BCBS, which the patient says is not what they use. We should be using Medicaid, but we do not have a copy of the medicaid card. I told the patient we would need a copy of the Medicaid card in order to complete a PA, the patient said they lost the card. I told them they would have to replace the card and get us  a copy before I could proceed with the PA. Patient agreed that they understood.

## 2024-03-07 NOTE — Telephone Encounter (Signed)
 Prior authorization done via cover my meds for patients. Wegovy. Waiting on determination.

## 2024-03-09 DIAGNOSIS — N939 Abnormal uterine and vaginal bleeding, unspecified: Secondary | ICD-10-CM | POA: Diagnosis not present

## 2024-03-09 DIAGNOSIS — Z6841 Body Mass Index (BMI) 40.0 and over, adult: Secondary | ICD-10-CM | POA: Diagnosis not present

## 2024-03-09 DIAGNOSIS — E282 Polycystic ovarian syndrome: Secondary | ICD-10-CM | POA: Diagnosis not present

## 2024-03-09 DIAGNOSIS — Z8742 Personal history of other diseases of the female genital tract: Secondary | ICD-10-CM | POA: Diagnosis not present

## 2024-03-09 NOTE — Assessment & Plan Note (Signed)
 Anthropometric Measurements Height: 5' 4 (1.626 m) Weight: 245 lb (111.1 kg) BMI (Calculated): 42.03 Weight at Last Visit: 248 lb Weight Lost Since Last Visit: 3 Weight Gained Since Last Visit: 0 Starting Weight: 245 lb Total Weight Loss (lbs): 0 lb (0 kg) Body Composition  Body Fat %: 49.3 % Fat Mass (lbs): 120.8 lbs Muscle Mass (lbs): 118 lbs Total Body Water (lbs): 89.8 lbs Visceral Fat Rating : 12 Other Clinical Data Today's Visit #: 7 Starting Date: 08/24/23 Comments: PC/[place]

## 2024-03-13 ENCOUNTER — Telehealth (INDEPENDENT_AMBULATORY_CARE_PROVIDER_SITE_OTHER): Admitting: Psychology

## 2024-03-13 DIAGNOSIS — F429 Obsessive-compulsive disorder, unspecified: Secondary | ICD-10-CM

## 2024-03-13 DIAGNOSIS — F5089 Other specified eating disorder: Secondary | ICD-10-CM

## 2024-03-13 DIAGNOSIS — F909 Attention-deficit hyperactivity disorder, unspecified type: Secondary | ICD-10-CM | POA: Diagnosis not present

## 2024-03-13 DIAGNOSIS — F319 Bipolar disorder, unspecified: Secondary | ICD-10-CM

## 2024-03-13 NOTE — Progress Notes (Signed)
  Office: [phone]  /  Fax: [phone]    Date: March 13, 2024  Appointment Start Time: 9:05am Duration: 26 minutes Provider: Catherene Close, Psy.D. Type of Session: Individual Therapy  Location of Patient: Home (private location) Location of Provider: Provider's Home (private office) Type of Contact: Telepsychological Visit via MyChart Video Visit  Session Content: Janet Palmer is a 28 y.o. adult presenting for a follow-up appointment to address the previously established treatment goal of increasing coping skills.Today's appointment was a telepsychological visit. Janet Palmer provided verbal consent for today's telepsychological appointment and Janet Palmer is aware Janet Palmer is responsible for securing confidentiality on Janet Palmer's end of the session. Prior to proceeding with today's appointment, Janet Palmer's physical location at the time of this appointment was obtained as well a phone number Janet Palmer could be reached at in the event of technical difficulties. Janet Palmer and this provider participated in today's telepsychological service.   This provider conducted a brief check-in. Janet Palmer shared she purchased previously discussed high protein snacks; however, she disclosed she does not like to eat when she does not feel hungry. Explored variables that may be impacting appetite (e.g., Vyvanse , ADHD-related sxs). Psychoeducation provided regarding habit stacking. Explored current habits and habits Janet Palmer wants to implement as it relates to Janet Palmer's eating. Focused on making stacks to help Janet Palmer meet Janet Palmer's goals with the clinic (e.g., Janet Palmer will eat something with protein before she leaves home every day; Janet Palmer will eat something with protein as soon as she returns home in the evening; Janet Palmer will eat a protein snack while engaging in a pleasurable activity after dinner/later in the evening). Overall, Janet Palmer was receptive to today's appointment as evidenced by openness to sharing, responsiveness to feedback, and willingness to implement discussed strategies  .  Mental Status Examination:  Appearance: neat Behavior: appropriate to circumstances Mood: neutral Affect: mood congruent Speech: WNL Eye Contact: appropriate Psychomotor Activity: WNL Gait: unable to assess Thought Process: linear, logical, and goal directed and denies suicidal, homicidal, and self-harm ideation, plan and intent  Thought Content/Perception: no hallucinations, delusions, bizarre thinking or behavior endorsed or observed Orientation: AAOx4 Memory/Concentration: intact Insight: fair Judgment: fair  Interventions:  Conducted a brief chart review Conducted a risk assessment Provided empathic reflections and validation Reviewed content from the previous session Provided positive reinforcement Employed supportive psychotherapy interventions to facilitate reduced distress and to improve coping skills with identified stressors Engaged patient in problem solving Psychoeducation provided regarding habit stacking   DSM-5 Diagnosis(es): F50.89 Other Specified Feeding or Eating Disorder, Emotional Eating Behaviors, F90.9 Unspecified Attention-Deficit/Hyperactivity Disorder , 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 9 Unspecified Bipolar and Related Disorder   Treatment Goal & Progress: During the initial appointment with this provider, the following treatment goal was established: increase coping skills. Janet Palmer has demonstrated progress in Janet Palmer's goal as evidenced by increased awareness of hunger patterns and willingness to implement discussed strategies to improve adherence to clinic goals.     Plan: The next appointment is scheduled for 03/27/2024 at 9:30am, which will be via MyChart Video Visit. The next session will focus on working towards the established treatment goal. Janet Palmer will continue with Janet Palmer's primary therapist.    Catherene Close, PsyD

## 2024-03-27 ENCOUNTER — Telehealth (INDEPENDENT_AMBULATORY_CARE_PROVIDER_SITE_OTHER): Admitting: Psychology

## 2024-03-27 DIAGNOSIS — F909 Attention-deficit hyperactivity disorder, unspecified type: Secondary | ICD-10-CM | POA: Diagnosis not present

## 2024-03-27 DIAGNOSIS — F319 Bipolar disorder, unspecified: Secondary | ICD-10-CM | POA: Diagnosis not present

## 2024-03-27 DIAGNOSIS — F5089 Other specified eating disorder: Secondary | ICD-10-CM

## 2024-03-27 DIAGNOSIS — F429 Obsessive-compulsive disorder, unspecified: Secondary | ICD-10-CM | POA: Diagnosis not present

## 2024-03-27 NOTE — Progress Notes (Signed)
  Office: [phone]  /  Fax: [phone]    Date: March 27, 2024  Appointment Start Time: 9:31am Duration: 40 minutes Provider: Wyatt Fire, Psy.D. Type of Session: Individual Therapy  Location of Patient: Home (private location) Location of Provider: Provider's Home (private office) Type of Contact: Telepsychological Visit via MyChart Video Visit  Session Content: Janet Palmer is a 28 y.o. adult presenting for a follow-up appointment to address the previously established treatment goal of increasing coping skills.Today's appointment was a telepsychological visit. [name] provided verbal consent for today's telepsychological appointment and Janet Palmer is aware Janet Palmer is responsible for securing confidentiality on Janet Palmer's end of the session. Prior to proceeding with today's appointment, Janet Palmer's physical location at the time of this appointment was obtained as well a phone number Janet Palmer could be reached at in the event of technical difficulties. Janet Palmer and this provider participated in today's telepsychological service.   This provider conducted a brief check-in. Janet Palmer stated, Not much has changed. 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 Notably, she acknowledged she is very depressed. Further explored. She stated the onset was likely in April and she recently discontinued with her primary therapist. Explored previous periods of depression and what helped her cope. She identified job-related stress and her mother's health concerns likely triggered this depressive episode. She disclosed her mother's cancer is terminal. Remainder of today's appointment focused on self-care, coping with her mother's terminal illness, and anticipatory grief. Overall, Janet Palmer was receptive to today's appointment as evidenced by openness to sharing, responsiveness to feedback, and willingness to implement discussed strategies .  Mental Status Examination:   Appearance: neat Behavior: appropriate to circumstances Mood: very depressed  Affect: mood congruent; tearful  Speech: WNL Eye Contact: appropriate Psychomotor Activity: WNL Gait: unable to assess Thought Process: linear, logical, and goal directed and denies suicidal, homicidal, and self-harm ideation, plan and intent  Thought Content/Perception: no hallucinations, delusions, bizarre thinking or behavior endorsed or observed Orientation: AAOx4 Memory/Concentration: intact Insight: fair Judgment: fair  Interventions:  Conducted a brief chart review Conducted a risk assessment Provided empathic reflections and validation Provided positive reinforcement Employed supportive psychotherapy interventions to facilitate reduced distress and to improve coping skills with identified stressors Psychoeducation provided regarding self-care Psychoeducation provided regarding anticipatory grief   DSM-5 Diagnosis(es): F50.89 Other Specified Feeding or Eating Disorder, Emotional Eating Behaviors, F90.9 Unspecified Attention-Deficit/Hyperactivity Disorder , 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 9 Unspecified Bipolar and Related Disorder   Treatment Goal & Progress: During the initial appointment with this provider, the following treatment goal was established: increase coping skills. Takeyah has demonstrated progress in Janet Palmer's goal as evidenced by increased awareness of hunger patterns and willingness to implement discussed strategies to improve adherence to clinic goals.   Plan: The next appointment is scheduled for 04/11/2024 at 4pm, which will be via MyChart Video Visit. The next session will focus on working towards the established treatment goal. Janet Palmer provided verbal consent for this provider to e-mail referral options.    Wyatt Fire, PsyD

## 2024-04-04 ENCOUNTER — Encounter (INDEPENDENT_AMBULATORY_CARE_PROVIDER_SITE_OTHER): Payer: Self-pay | Admitting: Family Medicine

## 2024-04-04 ENCOUNTER — Ambulatory Visit (INDEPENDENT_AMBULATORY_CARE_PROVIDER_SITE_OTHER): Admitting: Family Medicine

## 2024-04-04 VITALS — BP 111/77 | HR 84 | Temp 98.1°F | Ht 64.0 in | Wt 246.0 lb

## 2024-04-04 DIAGNOSIS — Z6841 Body Mass Index (BMI) 40.0 and over, adult: Secondary | ICD-10-CM | POA: Diagnosis not present

## 2024-04-04 DIAGNOSIS — R7303 Prediabetes: Secondary | ICD-10-CM | POA: Diagnosis not present

## 2024-04-04 NOTE — Progress Notes (Signed)
   SUBJECTIVE:  Chief Complaint: Obesity  Interim History: Patient picked up Wegovy  after last appointment and she didn't start it yet because she had some questions. She needed another demonstration about mechanism of use.  She is wondering if she can take the Wegovy  and metformin  at the same time.  She is supposed to find out today if she gets another interview for a job she wants. She has not had much of an appetite over the last few weeks.  She has been spending quite a bit of time with her mom.  She is going to the beach with her family in August and has a convention in September.   [name] is here to discuss Janet Palmer's progress with Janet Palmer's obesity treatment plan. Janet Palmer is on the practicing portion control and making smarter food choices, such as increasing vegetables and decreasing simple carbohydrates and states Janet Palmer is following Janet Palmer's eating plan approximately 50 % of the time. Janet Palmer states Janet Palmer is not exercising.  OBJECTIVE: Visit Diagnoses: Problem List Items Addressed This Visit   None   Vitals Temp: 98.1 F (36.7 C) BP: 111/77 Pulse Rate: 84 SpO2: 97 %   Anthropometric Measurements Height: 5' 4 (1.626 m) Weight: 246 lb (111.6 kg) BMI (Calculated): 42.21 Weight at Last Visit: 245 lb Weight Lost Since Last Visit: 0 Weight Gained Since Last Visit: 1 Starting Weight: 245 lb Total Weight Loss (lbs): 0 lb (0 kg)   Body Composition  Body Fat %: 47.1 % Fat Mass (lbs): 116 lbs Muscle Mass (lbs): 123.8 lbs Total Body Water (lbs): 91.4 lbs Visceral Fat Rating : 12   Other Clinical Data Fasting: yes Labs: no Today's Visit #: 8 Starting Date: 08/24/23 Comments: PC/[place]     ASSESSMENT AND PLAN: Assessment & Plan Prediabetes Last A1c of 6.0.  Patient is working on limiting simple carbohydrates.  We discussed focusing on eating one nutritious meal daily.  Severe obesity (BMI >= 40) (HCC)  BMI 40.0-44.9, adult (HCC)    Diet: Nikky is currently in the action stage of change. As  such, Janet Palmer's goal is to continue with weight loss efforts and has agreed to practicing portion control and making smarter food choices, such as increasing vegetables and decreasing simple carbohydrates. Patient would like to eat the snacks she bought and not go so long between times she eats.   Exercise:  All adults should avoid inactivity. Some activity is better than none, and adults who participate in any amount of physical activity, gain some health benefits.  Behavior Modification:  We discussed the following Behavioral Modification Strategies today: increasing lean protein intake, decreasing simple carbohydrates, increasing vegetables, and meal planning and cooking strategies. We discussed various medication options to help [place] with Janet Palmer's weight loss efforts and we both agreed to start wegovy  at 0.25mg  dose.  Return in about 4 weeks (around 05/02/2024).   Janet Palmer was informed of the importance of frequent follow up visits to maximize Janet Palmer's success with intensive lifestyle modifications for Janet Palmer's multiple health conditions.  Attestation Statements:   Reviewed by clinician on day of visit: allergies, medications, problem list, medical history, surgical history, family history, social history, and previous encounter notes.     Adelita Cho, MD

## 2024-04-06 DIAGNOSIS — F4312 Post-traumatic stress disorder, chronic: Secondary | ICD-10-CM | POA: Diagnosis not present

## 2024-04-09 ENCOUNTER — Other Ambulatory Visit (INDEPENDENT_AMBULATORY_CARE_PROVIDER_SITE_OTHER): Payer: Self-pay | Admitting: Family Medicine

## 2024-04-10 ENCOUNTER — Telehealth (INDEPENDENT_AMBULATORY_CARE_PROVIDER_SITE_OTHER): Admitting: Psychology

## 2024-04-10 DIAGNOSIS — F429 Obsessive-compulsive disorder, unspecified: Secondary | ICD-10-CM

## 2024-04-10 DIAGNOSIS — F5089 Other specified eating disorder: Secondary | ICD-10-CM

## 2024-04-10 DIAGNOSIS — F909 Attention-deficit hyperactivity disorder, unspecified type: Secondary | ICD-10-CM | POA: Diagnosis not present

## 2024-04-10 DIAGNOSIS — F319 Bipolar disorder, unspecified: Secondary | ICD-10-CM

## 2024-04-10 NOTE — Progress Notes (Signed)
 Office: [phone]  /  Fax: [phone]    Date: April 10, 2024  Appointment Start Time: 11:32am Duration: 31 minutes Provider: Wyatt Fire, Psy.D. Type of Session: Individual Therapy  Location of Patient: Mother's house (address obtained; private location) Location of Provider: Provider's Home (private office) Type of Contact: Telepsychological Visit via MyChart Video Visit  Session Content: Janet Palmer is a 28 y.o. adult presenting for a follow-up appointment to address the previously established treatment goal of increasing coping skills.Today's appointment was a telepsychological visit. Janet Palmer provided verbal consent for today's telepsychological appointment and Janet Palmer is aware Janet Palmer is responsible for securing confidentiality on Janet Palmer's end of the session. Prior to proceeding with today's appointment, Janet Palmer's physical location at the time of this appointment was obtained as well a phone number Janet Palmer could be reached at in the event of technical difficulties. Janet Palmer and this provider participated in today's telepsychological service.   This provider conducted a brief check-in. Janet Palmer shared, Not much has changed. She noted she found a new primary therapist Janet Palmer, MSW), noting she had one appointment and she loved the provider. Janet Palmer agreed to sign an authorization for coordination of care if deemed necessary. A risk assessment was completed. Janet Palmer reported experiencing thoughts while driving (Just drive off the road.). However, she added, I don't want to die. Janet Palmer indicated it is attention seeking. She stated she first experienced the aforementioned prior to her mother's diagnosis, noting the frequency as once every month or two but it can occur for three solid days when driving. She stated she last experienced suicidal ideation when driving two days ago. She denied making any gestures, adding she just wants to be taken care. Janet Palmer continues to acknowledge understanding  regarding the importance of reaching out to trusted individuals and/or emergency resources if Janet Palmer is unable to ensure safety. She also stated she is taking all medications as provided. She also described future plans that include ongoing efforts to improve Janet Palmer's well-being and eating habits. The desire to be taken care was further explored. Janet Palmer acknowledged her mother would come over in the past to clean her apartment when Janet Palmer needed to be taken care of. She also stated her mother would help her with eating and completing daily tasks. Janet Palmer was engaged in problem solving. She agreed to use her prescribed structured meal plan to choose something to eat when she experiences decision paralysis. Regarding daily tasks, she was receptive to making a list of things she has to do to keep her place clean and engage in proper hygiene and prioritize three items a day. Notably, she plans to speak with her mother further about the abovementioned. Overall, Janet Palmer was receptive to today's appointment as evidenced by openness to sharing, responsiveness to feedback, and willingness to implement discussed strategies .  Mental Status Examination:  Appearance: neat Behavior: appropriate to circumstances Mood: depressed Affect: mood congruent; tearful throughout the appointment Speech: WNL Eye Contact: appropriate Psychomotor Activity: WNL Gait: unable to assess Thought Process: linear, logical, and goal directed, disorganized, and denies suicidal, homicidal, and self-harm ideation, plan and intent  Thought Content/Perception: no hallucinations, delusions, bizarre thinking or behavior endorsed or observed Orientation: AAOx4 Memory/Concentration: intact Insight: fair Judgment: fair  Interventions:  Conducted a brief chart review Conducted a risk assessment Provided empathic reflections and validation Reviewed content from the previous session Provided positive reinforcement Employed supportive psychotherapy  interventions to facilitate reduced distress and to improve coping skills with identified stressors Engaged patient in problem solving  DSM-5 Diagnosis(es): F50.89 Other  Specified Feeding or Eating Disorder, Emotional Eating Behaviors, F90.9 Unspecified Attention-Deficit/Hyperactivity Disorder , 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 9 Unspecified Bipolar and Related Disorder   Treatment Goal & Progress:  During the initial appointment with this provider, the following treatment goal was established: increase coping skills. Janet Palmer has demonstrated progress in Janet Palmer's goal as evidenced by increased awareness of hunger patterns and willingness to implement discussed strategies to improve adherence to clinic goals.   Plan: The next appointment is scheduled for 04/24/2024 at 12:30pm, which will be via MyChart Video Visit. The next session will focus on working towards the established treatment goal. Janet Palmer will continue with Janet Palmer's primary therapist.     Wyatt Fire, PsyD

## 2024-04-10 NOTE — Assessment & Plan Note (Signed)
 Last A1c of 6.0.  Patient is working on limiting simple carbohydrates.  We discussed focusing on eating one nutritious meal daily.

## 2024-04-11 ENCOUNTER — Telehealth (INDEPENDENT_AMBULATORY_CARE_PROVIDER_SITE_OTHER): Admitting: Psychology

## 2024-04-17 ENCOUNTER — Ambulatory Visit: Admitting: Family Medicine

## 2024-04-18 ENCOUNTER — Encounter: Attending: Physical Medicine and Rehabilitation | Admitting: Physical Medicine and Rehabilitation

## 2024-04-18 VITALS — BP 128/91 | HR 94 | Ht 64.0 in | Wt 254.0 lb

## 2024-04-18 DIAGNOSIS — G8929 Other chronic pain: Secondary | ICD-10-CM | POA: Diagnosis not present

## 2024-04-18 DIAGNOSIS — F32A Depression, unspecified: Secondary | ICD-10-CM | POA: Diagnosis not present

## 2024-04-18 DIAGNOSIS — M797 Fibromyalgia: Secondary | ICD-10-CM | POA: Insufficient documentation

## 2024-04-18 MED ORDER — TOPIRAMATE 25 MG PO TABS: 25.0000 mg | ORAL_TABLET | Freq: Every evening | ORAL | 3 refills | Status: AC

## 2024-04-18 NOTE — Progress Notes (Signed)
 Subjective:    Patient ID: Janet Palmer, adult    DOB: 11/02/1995, 28 y.o.   MRN: [id number]  HPI Janet Palmer is a 28 year old woman who presents to establish care for chronic pain.  1) Chronic pain: -failed cymbalta, naproxen, cane -has been having as long as she can remember since childhood -fibromyalgia was discussed with her in the past -had COVID in 2021 and her symptoms have worsened since this time -she has had trauma throughout her life  2) Depression/anixety/bipolar 2 disorder/OCD/ADHD: -she takes Abilify , Vyvanse    Pain Inventory Average Pain 5 Pain Right Now 4 My pain is intermittent, constant, dull, and aching  In the last 24 hours, has pain interfered with the following? General activity 7 Relation with others 4 Enjoyment of life 6 What TIME of day is your pain at its worst? daytime Sleep (in general) Fair  Pain is worse with: walking, bending, standing, and some activites Pain improves with: rest Relief from Meds: 2  walk with assistance use a cane how many minutes can you walk? 45-60 ability to climb steps?  yes do you drive?  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  depression anxiety suicidal thoughts  New pt  New pt    Family History  Problem Relation Age of Onset   Diabetes Mother    Depression Mother    Alcohol abuse Mother    Anxiety disorder Mother    Obesity Mother    Anxiety disorder Father    Thyroid  disease Father    Hypertension Father    Hyperlipidemia Father    Stroke Father    Sleep apnea Father    OCD Brother    Depression Brother    Cancer Maternal Grandfather        pancreatic   Hypertension Maternal Grandfather    Diabetes Maternal Grandmother    Cancer Maternal Grandmother        lung with METS   Depression Maternal Grandmother    Social History   Socioeconomic History   Marital status: Single    Spouse name: Not on file   Number of children: Not on file   Years of education: Not  on file   Highest education level: Bachelor's degree (e.g., BA, AB, BS)  Occupational History   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[employer]  Tobacco Use   Smoking status: Never   Smokeless tobacco: Never  Vaping Use   Vaping status: Never Used  Substance and Sexual Activity   Alcohol use: Not Currently   Drug use: Never   Sexual activity: Not Currently    Partners: Female, Female    Birth control/protection: Other-see comments    Comment: Lack of penetrative sex  Other Topics Concern   Not on file  Social History Narrative   Not on file   Social Drivers of Health   Financial Resource Strain: Medium Risk (10/19/2023)   Overall Financial Resource Strain (CARDIA)    Difficulty of Paying Living Expenses: Somewhat hard  Food Insecurity: No Food Insecurity (10/19/2023)   Hunger Vital Sign    Worried About Running Out of Food in the Last Year: Never true    Ran Out of Food in the Last Year: Never true  Transportation Needs: No Transportation Needs (10/19/2023)   PRAPARE - Administrator, Civil Service (Medical): No    Lack of Transportation (Non-Medical): No  Physical Activity: Insufficiently Active (10/19/2023)   Exercise Vital Sign  Days of Exercise per Week: 2 days    Minutes of Exercise per Session: 20 min  Stress: Stress Concern Present (10/19/2023)   [employer] of Occupational Health - Occupational Stress Questionnaire    Feeling of Stress : Rather much  Social Connections: Socially Isolated (10/19/2023)   Social Connection and Isolation Panel    Frequency of Communication with Friends and Family: Three times a week    Frequency of Social Gatherings with Friends and Family: Twice a week    Attends Religious Services: Never    Database administrator or Organizations: No    Attends Engineer, structural: Not on file    Marital Status: Never married   Past Surgical History:  Procedure Laterality Date   arm fracture Left 2006   FRACTURE  SURGERY     WISDOM TOOTH EXTRACTION  2017   Past Medical History:  Diagnosis Date   ADHD    Allergy    Anxiety    B12 deficiency    Back pain    Bipolar 2 disorder (HCC)    Constipation    Depression    Eating disorder    Fibromyalgia    Heartburn    Joint pain    OCD (obsessive compulsive disorder)    PCOS (polycystic ovarian syndrome)    Prediabetes    PTSD (post-traumatic stress disorder)    Vitamin D  deficiency    BP (!) 128/91 (BP Location: Left Arm, Patient Position: Sitting, Cuff Size: Large)   Pulse 94   Ht 5' 4 (1.626 m)   Wt 254 lb (115.2 kg)   LMP 02/27/2024   SpO2 97%   BMI 43.60 kg/m   Opioid Risk Score:   Fall Risk Score:  `1  Depression screen [facility] 2/9     02/09/2024    8:52 AM 09/18/2023    8:59 AM 06/02/2023    3:43 PM  Depression screen PHQ 2/9  Decreased Interest 1  1  Down, Depressed, Hopeless 2  2  PHQ - 2 Score 3  3  Altered sleeping 3  3  Tired, decreased energy 3  3  Change in appetite 3  3  Feeling bad or failure about yourself  2  2  Trouble concentrating 1  3  Moving slowly or fidgety/restless 0  2  Suicidal thoughts 1  1  PHQ-9 Score 16  20  Difficult doing work/chores   Very difficult     Information is confidential and restricted. Go to Review Flowsheets to unlock data.      Review of Systems  Constitutional:  Positive for diaphoresis.  Gastrointestinal:  Positive for diarrhea.  Musculoskeletal:  Positive for back pain, gait problem, myalgias and neck pain.       B/L shoulder, elbow, wrist, ankle pain B/L whole leg in the front, lower leg in back  Psychiatric/Behavioral:  Positive for dysphoric mood and suicidal ideas. The patient is nervous/anxious.   All other systems reviewed and are negative.      Objective:   Physical Exam Gen: no distress, normal appearing HEENT: oral mucosa pink and moist, NCAT Cardio: Reg rate Chest: normal effort, normal rate of breathing Abd: soft, non-distended Ext: no edema Psych:  pleasant, normal affect Skin: intact Neuro: Alert and oriented x3 MSK: TTP in bilateral upper back muscles       Assessment & Plan:  1). Fibromyalgia: -discussed trigger point injections next visit -aquatherapy prescribed  -discussed topamax  -discussed mechanism of action of low dose naltrexone  as an opioid receptor antagonist which stimulates your body's production of its own natural endogenous opioids, helping to decrease pain. Discussed that it can also decrease T cell response and thus be helpful in decreasing inflammation, and symptoms of brain fog, fatigue, anxiety, depression, and allergies. Discussed that this medication needs to be compounded at a compounding pharmacy and can more expensive. Discussed that I usually start at 1mg  and if this is not providing enough relief then I titrate upward on a monthly basis.    -Discussed Qutenza as an option for neuropathic pain control. Discussed that this is a capsaicin patch, stronger than capsaicin cream. Discussed that it is currently approved for diabetic peripheral neuropathy and post-herpetic neuralgia, but that it has also shown benefit in treating other forms of neuropathy. Provided patient with link to site to learn more about the patch: https://www.clark.biz/. Discussed that the patch would be placed in office and benefits usually last 3 months. Discussed that unintended exposure to capsaicin can cause severe irritation of eyes, mucous membranes, respiratory tract, and skin, but that Qutenza is a local treatment and does not have the systemic side effects of other nerve medications. Discussed that there may be pain, itching, erythema, and decreased sensory function associated with the application of Qutenza. Side effects usually subside within 1 week. A cold pack of analgesic medications can help with these side effects. Blood pressure can also be increased due to pain associated with administration of the patch.   Foods that may reduce  pain: 1) Ginger (especially studied for arthritis)- reduce leukotriene production to decrease inflammation 2) Blueberries- high in phytonutrients that decrease inflammation 3) Salmon- marine omega-3s reduce joint swelling and pain 4) Pumpkin seeds- reduce inflammation 5) dark chocolate- reduces inflammation 6) turmeric- reduces inflammation 7) tart cherries - reduce pain and stiffness 8) extra virgin olive oil - its compound olecanthal helps to block prostaglandins  9) chili peppers- can be eaten or applied topically via capsaicin 10) mint- helpful for headache, muscle aches, joint pain, and itching 11) garlic- reduces inflammation 12) Green tea- reduces inflammation and oxidative stress, helps with weight loss, may reduce the risk of cancer, recommend Double Green Matcha [place] of Tea daily  Link to further information on diet for chronic pain: [email or web address].bray.[email or web address]   2) Depression/anxiety: -topamax  25mg  HS  3) Obesity: -continue metformin 

## 2024-04-18 NOTE — Patient Instructions (Signed)
 Foods that may reduce pain: 1) Ginger (especially studied for arthritis)- reduce leukotriene production to decrease inflammation 2) Blueberries- high in phytonutrients that decrease inflammation 3) Salmon- marine omega-3s reduce joint swelling and pain 4) Pumpkin seeds- reduce inflammation 5) dark chocolate- reduces inflammation 6) turmeric- reduces inflammation 7) tart cherries - reduce pain and stiffness 8) extra virgin olive oil - its compound olecanthal helps to block prostaglandins  9) chili peppers- can be eaten or applied topically via capsaicin 10) mint- helpful for headache, muscle aches, joint pain, and itching 11) garlic- reduces inflammation 12) Green tea- reduces inflammation and oxidative stress, helps with weight loss, may reduce the risk of cancer, recommend Double Green Matcha [place] of Tea daily  Link to further information on diet for chronic pain: http://www.bray.com/

## 2024-04-19 ENCOUNTER — Telehealth (INDEPENDENT_AMBULATORY_CARE_PROVIDER_SITE_OTHER): Payer: Self-pay | Admitting: *Deleted

## 2024-04-19 ENCOUNTER — Encounter: Payer: Self-pay | Admitting: Family Medicine

## 2024-04-19 ENCOUNTER — Ambulatory Visit: Admitting: Family Medicine

## 2024-04-19 VITALS — BP 122/76 | HR 95 | Temp 97.6°F | Ht 64.0 in | Wt 256.0 lb

## 2024-04-19 DIAGNOSIS — Z6841 Body Mass Index (BMI) 40.0 and over, adult: Secondary | ICD-10-CM

## 2024-04-19 DIAGNOSIS — E7800 Pure hypercholesterolemia, unspecified: Secondary | ICD-10-CM

## 2024-04-19 DIAGNOSIS — F4312 Post-traumatic stress disorder, chronic: Secondary | ICD-10-CM | POA: Diagnosis not present

## 2024-04-19 DIAGNOSIS — E559 Vitamin D deficiency, unspecified: Secondary | ICD-10-CM | POA: Diagnosis not present

## 2024-04-19 DIAGNOSIS — R7303 Prediabetes: Secondary | ICD-10-CM

## 2024-04-19 NOTE — Patient Instructions (Signed)
 Please follow-up tomorrow morning fasting (nothing to eat or drink except water for at least 8 hours).  I will be in touch with your results.  Let us  know if there is anything we can do to assist you

## 2024-04-19 NOTE — Progress Notes (Signed)
 Subjective:     Patient ID: Janet Palmer, adult    DOB: 04/18/1996, 28 y.o.   MRN: [id number]  Chief Complaint  Patient presents with   Medical Management of Chronic Issues    6 month f/u    HPI   History of Present Illness         She is here for follow-up on chronic health conditions. She sees [facility] regularly.   She saw pain management, Dr. Atha.  States she will start Aqu[employer]   OB/GYN at [facility]. Exam scheduled.   Stress due to unemployment and mother being sick.   Therapist is Elijah Barnacle online, and psychiatrist Barnie Gull in [place]      04/18/2024    1:02 PM 02/09/2024    8:52 AM 09/18/2023    8:59 AM 06/02/2023    3:43 PM  Depression screen PHQ 2/9  Decreased Interest 1 1  1   Down, Depressed, Hopeless 3 2  2   PHQ - 2 Score 4 3  3   Altered sleeping 3 3  3   Tired, decreased energy 3 3  3   Change in appetite 3 3  3   Feeling bad or failure about yourself  3 2  2   Trouble concentrating 2 1  3   Moving slowly or fidgety/restless 0 0  2  Suicidal thoughts 2 1  1   PHQ-9 Score 20 16  20   Difficult doing work/chores Extremely dIfficult   Very difficult     Information is confidential and restricted. Go to Review Flowsheets to unlock data.      Health Maintenance Due  Topic Date Due   HPV VACCINES (2 - 2-dose series) 03/28/2012   HIV Screening  Never done   Hepatitis C Screening  Never done   Hepatitis B Vaccines (1 of 3 - 19+ 3-dose series) Never done   Cervical Cancer Screening (Pap smear)  Never done   COVID-19 Vaccine (3 - Pfizer risk series) 02/13/2020   DTaP/Tdap/Td (2 - Td or Tdap) 09/28/2020    Past Medical History:  Diagnosis Date   ADHD    Allergy    Anxiety    B12 deficiency    Back pain    Bipolar 2 disorder (HCC)    Constipation    Depression    Eating disorder    Fibromyalgia    Heartburn    Joint pain    OCD (obsessive compulsive disorder)    PCOS (polycystic ovarian syndrome)    Prediabetes    PTSD  (post-traumatic stress disorder)    Vitamin D  deficiency     Past Surgical History:  Procedure Laterality Date   arm fracture Left 2006   FRACTURE SURGERY     WISDOM TOOTH EXTRACTION  2017    Family History  Problem Relation Age of Onset   Diabetes Mother    Depression Mother    Alcohol abuse Mother    Anxiety disorder Mother    Obesity Mother    Anxiety disorder Father    Thyroid  disease Father    Hypertension Father    Hyperlipidemia Father    Stroke Father    Sleep apnea Father    OCD Brother    Depression Brother    Cancer Maternal Grandfather        pancreatic   Hypertension Maternal Grandfather    Diabetes Maternal Grandmother    Cancer Maternal Grandmother        lung with METS   Depression Maternal Grandmother  Social History   Socioeconomic History   Marital status: Single    Spouse name: Not on file   Number of children: Not on file   Years of education: Not on file   Highest education level: Bachelor's degree (e.g., BA, AB, BS)  Occupational History   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[employer]  Tobacco Use   Smoking status: Never   Smokeless tobacco: Never  Vaping Use   Vaping status: Never Used  Substance and Sexual Activity   Alcohol use: Not Currently   Drug use: Never   Sexual activity: Not Currently    Partners: Female, Female    Birth control/protection: Other-see comments    Comment: Lack of penetrative sex  Other Topics Concern   Not on file  Social History Narrative   Not on file   Social Drivers of Health   Financial Resource Strain: Medium Risk (10/19/2023)   Overall Financial Resource Strain (CARDIA)    Difficulty of Paying Living Expenses: Somewhat hard  Food Insecurity: No Food Insecurity (10/19/2023)   Hunger Vital Sign    Worried About Running Out of Food in the Last Year: Never true    Ran Out of Food in the Last Year: Never true  Transportation Needs: No Transportation Needs (10/19/2023)   PRAPARE -  Administrator, Civil Service (Medical): No    Lack of Transportation (Non-Medical): No  Physical Activity: Insufficiently Active (10/19/2023)   Exercise Vital Sign    Days of Exercise per Week: 2 days    Minutes of Exercise per Session: 20 min  Stress: Stress Concern Present (10/19/2023)   [employer] of Occupational Health - Occupational Stress Questionnaire    Feeling of Stress : Rather much  Social Connections: Socially Isolated (10/19/2023)   Social Connection and Isolation Panel    Frequency of Communication with Friends and Family: Three times a week    Frequency of Social Gatherings with Friends and Family: Twice a week    Attends Religious Services: Never    Database administrator or Organizations: No    Attends Engineer, structural: Not on file    Marital Status: Never married  Intimate Partner Violence: Not on file    Outpatient Medications Prior to Visit  Medication Sig Dispense Refill   ARIPiprazole  (ABILIFY ) 5 MG tablet Take 1 tablet (5 mg total) by mouth daily. 90 tablet 1   Cholecalciferol (VITAMIN D3) 125 MCG (5000 UT) CAPS Take 1 capsule (5,000 Units total) by mouth daily.     diazepam (VALIUM) 5 MG tablet      hydrOXYzine (ATARAX) 10 MG tablet Take by mouth.     lisdexamfetamine (VYVANSE ) 30 MG capsule Take 1 capsule (30 mg total) by mouth every morning. 30 capsule 0   medroxyPROGESTERone  (PROVERA ) 10 MG tablet If no period > 90 days, take one tablet daily x 10 days     metFORMIN  (GLUCOPHAGE -XR) 500 MG 24 hr tablet Take 1 tablet (500 mg total) by mouth 2 (two) times daily with a meal. 90 tablet 1   topiramate  (TOPAMAX ) 25 MG tablet Take 1 tablet (25 mg total) by mouth at bedtime. 90 tablet 3   Semaglutide -Weight Management (WEGOVY ) 0.25 MG/0.5ML SOAJ Inject 0.25 mg into the skin once a week. (Patient not taking: Reported on 04/19/2024) 2 mL 0   No facility-administered medications prior to visit.    Allergies  Allergen Reactions    Amoxicillin    Lamictal [Lamotrigine] Dermatitis    Mannie Louder  syndrome   Motrin [Ibuprofen]    Other Other (See Comments)    Birth Control causes suicidal tendencies   Soy Allergy (Obsolete) Nausea And Vomiting    Other Reaction(s): GI Intolerance    Review of Systems  Constitutional:  Negative for chills and fever.  Respiratory:  Negative for shortness of breath.   Cardiovascular:  Negative for chest pain, palpitations and leg swelling.  Gastrointestinal:  Negative for abdominal pain, constipation, diarrhea, nausea and vomiting.  Genitourinary:  Negative for dysuria, frequency and urgency.  Neurological:  Negative for dizziness and focal weakness.  Psychiatric/Behavioral:  Positive for depression. Negative for suicidal ideas. The patient is nervous/anxious.        Objective:    Physical Exam Constitutional:      General: Janet Palmer is not in acute distress.    Appearance: Janet Palmer is not ill-appearing.  Eyes:     Extraocular Movements: Extraocular movements intact.     Conjunctiva/sclera: Conjunctivae normal.  Cardiovascular:     Rate and Rhythm: Normal rate.  Pulmonary:     Effort: Pulmonary effort is normal.  Musculoskeletal:     Cervical back: Normal range of motion and neck supple.  Skin:    General: Skin is warm and dry.  Neurological:     General: No focal deficit present.     Mental Status: Janet Palmer is alert and oriented to person, place, and time.     Motor: No weakness.     Coordination: Coordination normal.     Gait: Gait normal.  Psychiatric:        Attention and Perception: Attention normal.        Mood and Affect: Mood normal.        Speech: Speech normal.        Behavior: Behavior normal.        Thought Content: Thought content normal. Thought content does not include suicidal ideation. Thought content does not include suicidal plan.      BP 122/76   Pulse 95   Temp 97.6 F (36.4 C) (Temporal)   Ht 5' 4 (1.626 m)   Wt 256 lb (116.1 kg)   LMP 02/27/2024    SpO2 97%   BMI 43.94 kg/m  Wt Readings from Last 3 Encounters:  04/19/24 256 lb (116.1 kg)  04/18/24 254 lb (115.2 kg)  04/04/24 246 lb (111.6 kg)       Assessment & Plan:   Problem List Items Addressed This Visit     Prediabetes - Primary   Relevant Orders   CBC   Comprehensive metabolic panel with GFR   Hemoglobin A1c   TSH   Severe obesity (BMI >= 40) (HCC)   Relevant Orders   CBC   Comprehensive metabolic panel with GFR   Hemoglobin A1c   Lipid panel   TSH   Vitamin D  deficiency   Relevant Orders   VITAMIN D  25 Hydroxy (Vit-D Deficiency, Fractures)   Other Visit Diagnoses       Pure hypercholesterolemia       Relevant Orders   Lipid panel      She is under the are of specialists for her chronic health conditions.  Labs ordered to follow up on prediabetes, HLD and vitamin D  deficiencies.  Denies plan for self harm and states she feels safe.  Follow up in 6 months or sooner if needed.   I have discontinued Reilly Molchan Janet Palmer's Wegovy . I am also having Janet Palmer maintain Janet Palmer's hydrOXYzine, diazepam, medroxyPROGESTERone , Vitamin D3, metFORMIN , lisdexamfetamine,  ARIPiprazole , and topiramate .  No orders of the defined types were placed in this encounter.

## 2024-04-20 ENCOUNTER — Ambulatory Visit: Payer: BC Managed Care – PPO | Admitting: Family Medicine

## 2024-04-20 NOTE — Telephone Encounter (Signed)
 Patient called stating she left her medication (Wegovy ) out of the fridge for about 2 weeks. Per patient she was not aware it was supposed to be refrigerated. She requested another prescription for medication to be sent to the pharmacy. I explained to her, even though if we send another prescription, her insurance would not cover the medication as it is too early and she would have to pay for it out of pocket. Patient agreed she will wait until next refill to began medication as she can not afford to pay full price for the medication. Verbalized understanding and will discuss with provider at her next office visit.

## 2024-04-24 ENCOUNTER — Telehealth (INDEPENDENT_AMBULATORY_CARE_PROVIDER_SITE_OTHER): Admitting: Psychology

## 2024-04-25 ENCOUNTER — Other Ambulatory Visit (HOSPITAL_COMMUNITY): Payer: Self-pay | Admitting: Psychiatry

## 2024-04-25 ENCOUNTER — Telehealth (INDEPENDENT_AMBULATORY_CARE_PROVIDER_SITE_OTHER): Admitting: Psychology

## 2024-04-25 ENCOUNTER — Encounter (HOSPITAL_COMMUNITY): Payer: Self-pay

## 2024-04-25 DIAGNOSIS — F909 Attention-deficit hyperactivity disorder, unspecified type: Secondary | ICD-10-CM | POA: Diagnosis not present

## 2024-04-25 DIAGNOSIS — F5089 Other specified eating disorder: Secondary | ICD-10-CM | POA: Diagnosis not present

## 2024-04-25 DIAGNOSIS — F429 Obsessive-compulsive disorder, unspecified: Secondary | ICD-10-CM | POA: Diagnosis not present

## 2024-04-25 DIAGNOSIS — F319 Bipolar disorder, unspecified: Secondary | ICD-10-CM | POA: Diagnosis not present

## 2024-04-25 MED ORDER — LISDEXAMFETAMINE DIMESYLATE 30 MG PO CAPS: 30.0000 mg | ORAL_CAPSULE | ORAL | 0 refills | Status: DC

## 2024-04-25 NOTE — Telephone Encounter (Signed)
 I sent it in today but you need to call our office for an appt [phone]

## 2024-04-25 NOTE — Progress Notes (Signed)
 Office: [phone]  /  Fax: [phone]    Date: April 25, 2024  Appointment Start Time: 10:30am Duration: 43 minutes Provider: Wyatt Fire, Psy.D. Type of Session: Individual Therapy  Location of Patient: Home (private location) Location of Provider: Provider's Home (private office) Type of Contact: Telepsychological Visit via MyChart Video Visit  Session Content: Janet Palmer is a 28 y.o. adult presenting for a follow-up appointment to address the previously established treatment goal of increasing coping skills.Today's appointment was a telepsychological visit. Janet Palmer provided verbal consent for today's telepsychological appointment and Janet Palmer is aware Janet Palmer is responsible for securing confidentiality on Janet Palmer's end of the session. Prior to proceeding with today's appointment, Janet Palmer's physical location at the time of this appointment was obtained as well a phone number Janet Palmer could be reached at in the event of technical difficulties. Janet Palmer and this provider participated in today's telepsychological service.   Of note, Janet Palmer presented on time for her MyChart Video Visit at 9:30am; however, due to a misunderstanding regarding today's appointment time, this provider presented at 9:47am for the appointment. Janet Palmer indicated she had an interview via telephone at 10:00am, noting a belief it would last approximately 20 minutes. As such, this provider recommended rescheduling to 10:30am today to ensure Janet Palmer has time to prepare for her interview and also have her full appointment time. Janet Palmer expressed understanding and was receptive to rescheduling to 10:30am with this provider. Duration of the aforementioned encounter was approximately one minute.   Janet Palmer presented on time for her 10:30am appointment and this provider conducted a brief check-in. Janet Palmer shared about her recent interview and provided an update regarding her previous interview. She further shared being really stressed out recently. Further  explored and processed. She discussed ongoing stressors (e.g., not having Janet Palmer  for two weeks and body image concerns) and preemptive grief. She contacted her psychiatrist via MyChart to refill her ADHD medication during today's appointment per this provider's recommendation. Janet Palmer of today's appointment focused on body image. Janet Palmer shared about an extensive hx of experiencing body image-related issues and it contributing to her feeling unlovable. Associated thoughts and feelings further explored and processed, including its impact on her journey with the clinic. Session concluded with this provider challenging Janet Palmer to explore what factors, other than body image, contribute to her self-esteem and encouraged her to reflect on how she can build upon these other areas of your life between now and the next appointment with this provider. Overall, Janet Palmer was receptive to today's appointment as evidenced by openness to sharing, responsiveness to feedback, and willingness to discuss body image-related concerns.   Mental Status Examination:  Appearance: neat Behavior: appropriate to circumstances Mood: sad Affect: mood congruent Speech: WNL Eye Contact: intermittent  Psychomotor Activity: WNL Gait: unable to assess Thought Process: linear, logical, and goal directed and denies suicidal, homicidal, and self-harm ideation, plan and intent  Thought Content/Perception: no hallucinations, delusions, bizarre thinking or behavior endorsed or observed Orientation: AAOx4 Memory/Concentration: intact Insight: fair Judgment: fair  Interventions:  Conducted a brief chart review Conducted a risk assessment Provided empathic reflections and validation Provided positive reinforcement Employed supportive psychotherapy interventions to facilitate reduced distress and to improve coping skills with identified stressors Psychoeducation provided regarding body image Engaged pt in thought challenging   Explored discrepancies   DSM-5 Diagnosis(es): F50.89 Other Specified Feeding or Eating Disorder, Emotional Eating Behaviors, F90.9 Unspecified Attention-Deficit/Hyperactivity Disorder , 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 9 Unspecified Bipolar and Related Disorder   Treatment Goal & Progress: During the initial  appointment with this provider, the following treatment goal was established: increase coping skills. Shirel has demonstrated progress in Janet Palmer's goal as evidenced by increased awareness of hunger patterns and willingness to implement discussed strategies to improve adherence to clinic goals.   Plan: The next appointment is scheduled for 05/09/2024 at 9am, which will be via MyChart Video Visit. The next session will focus on working towards the established treatment goal. Camyah will continue with Janet Palmer's primary therapist.     Wyatt Fire, PsyD

## 2024-04-28 ENCOUNTER — Other Ambulatory Visit (INDEPENDENT_AMBULATORY_CARE_PROVIDER_SITE_OTHER): Payer: Self-pay | Admitting: Family Medicine

## 2024-04-28 DIAGNOSIS — R7303 Prediabetes: Secondary | ICD-10-CM

## 2024-05-03 ENCOUNTER — Encounter (INDEPENDENT_AMBULATORY_CARE_PROVIDER_SITE_OTHER): Payer: Self-pay | Admitting: Family Medicine

## 2024-05-03 ENCOUNTER — Other Ambulatory Visit (INDEPENDENT_AMBULATORY_CARE_PROVIDER_SITE_OTHER): Payer: Self-pay | Admitting: Family Medicine

## 2024-05-03 ENCOUNTER — Ambulatory Visit (INDEPENDENT_AMBULATORY_CARE_PROVIDER_SITE_OTHER): Admitting: Family Medicine

## 2024-05-03 VITALS — BP 109/77 | HR 85 | Temp 98.0°F | Ht 64.0 in | Wt 245.0 lb

## 2024-05-03 DIAGNOSIS — Z6841 Body Mass Index (BMI) 40.0 and over, adult: Secondary | ICD-10-CM | POA: Diagnosis not present

## 2024-05-03 DIAGNOSIS — R7303 Prediabetes: Secondary | ICD-10-CM

## 2024-05-03 DIAGNOSIS — F4312 Post-traumatic stress disorder, chronic: Secondary | ICD-10-CM | POA: Diagnosis not present

## 2024-05-03 DIAGNOSIS — E559 Vitamin D deficiency, unspecified: Secondary | ICD-10-CM

## 2024-05-03 MED ORDER — WEGOVY 0.25 MG/0.5ML ~~LOC~~ SOAJ: 0.2500 mg | SUBCUTANEOUS | 0 refills | Status: DC

## 2024-05-03 NOTE — Progress Notes (Signed)
 SUBJECTIVE:  Chief Complaint: Obesity  Interim History: Patient has been looking for a job since last appointment.  She is having a hard time even getting an interview.  She is spending time with her mother as her mom undergoes cancer treatment.  She hasn't started topiramate  yet due to concerns about Janet Palmer's Johnson syndrome.  She is trying to do portion  control and healthy substitutes and adding protein where she can.  She is not eating to excess but being mindful of choice and quantity.  She is having GI issues regardless of what she is eating.  Trying get more consistent protein in. Does still have long gaps between meals due to lack of hunger.  Thinks contents of food has improved but consistency hasn't.  She is going to the beach with her family next week.  Janet Palmer is here to discuss Janet Palmer's progress with Janet Palmer's obesity treatment plan. Janet Palmer is on the practicing portion control and making smarter food choices, such as increasing vegetables and decreasing simple carbohydrates and states Janet Palmer is following Janet Palmer's eating plan approximately 60 % of the time. Janet Palmer states Janet Palmer is not exercising.   OBJECTIVE: Visit Diagnoses: Problem List Items Addressed This Visit       Other   Prediabetes - Primary   Vitamin D  deficiency   Other Visit Diagnoses       Morbid obesity (HCC)       Relevant Medications   semaglutide -weight management (WEGOVY ) 0.25 MG/0.5ML SOAJ SQ injection     BMI 40.0-44.9, adult (HCC)       Relevant Medications   semaglutide -weight management (WEGOVY ) 0.25 MG/0.5ML SOAJ SQ injection       Vitals Temp: 98 F (36.7 C) BP: 109/77 Pulse Rate: 85 SpO2: 97 %   Anthropometric Measurements Height: 5' 4 (1.626 m) Weight: 245 lb (111.1 kg) BMI (Calculated): 42.03 Weight at Last Visit: 246 lb Weight Lost Since Last Visit: 1 Weight Gained Since Last Visit: 0 Starting Weight: 245 lb Total Weight Loss (lbs): 0 lb (0 kg)   Body Composition  Body Fat %: 47.8 % Fat Mass (lbs):  117.4 lbs Muscle Mass (lbs): 122 lbs Total Body Water (lbs): 89.2 lbs Visceral Fat Rating : 12   Other Clinical Data Fasting: no Labs: no Today's Visit #: 9 Starting Date: 08/24/23 Comments: [facility]/[place]     ASSESSMENT AND PLAN: Assessment & Plan Prediabetes Working in being mindful of food choices and trying to get one nutritious food in daily.  She is planning to start GLP 1.  Will follow up at next appointment concerning possible side effects if any. Vitamin D  deficiency Did well on prescription strength Vitamin D .  No nausea, vomiting or muscle weakness.  Continue OTC Morbid obesity (HCC)  BMI 40.0-44.9, adult (HCC)    Diet: Janet Palmer is currently in the action stage of change. As such, Janet Palmer's goal is to continue with weight loss efforts and has agreed to practicing portion control and making smarter food choices, such as increasing vegetables and decreasing simple carbohydrates.   Exercise:  All adults should avoid inactivity. Some activity is better than none, and adults who participate in any amount of physical activity, gain some health benefits.  Would like to walk in the park 3 times a week for 10-15 minutes.  Behavior Modification:  We discussed the following Behavioral Modification Strategies today: increasing lean protein intake, decreasing simple carbohydrates, increasing vegetables, no skipping meals, meal planning and cooking strategies, and planning for success. We discussed various medication options  to help Ski with Janet Palmer's weight loss efforts and we both agreed to start wegovy  0.25mg  weekly and assess response to medication at next appointment.  Return in about 4 weeks (around 05/31/2024) for fasting labs.   Janet Palmer was informed of the importance of frequent follow up visits to maximize Janet Palmer's success with intensive lifestyle modifications for Janet Palmer's multiple health conditions.  Attestation Statements:   Reviewed by clinician on day of visit: allergies, medications, problem  list, medical history, surgical history, family history, social history, and previous encounter notes.     Janet Cho, MD

## 2024-05-05 ENCOUNTER — Encounter: Payer: Self-pay | Admitting: Neurology

## 2024-05-05 ENCOUNTER — Ambulatory Visit (INDEPENDENT_AMBULATORY_CARE_PROVIDER_SITE_OTHER): Admitting: Neurology

## 2024-05-05 VITALS — BP 122/70 | Ht 63.0 in | Wt 251.0 lb

## 2024-05-05 DIAGNOSIS — E7800 Pure hypercholesterolemia, unspecified: Secondary | ICD-10-CM

## 2024-05-05 DIAGNOSIS — R7303 Prediabetes: Secondary | ICD-10-CM | POA: Diagnosis not present

## 2024-05-05 DIAGNOSIS — R52 Pain, unspecified: Secondary | ICD-10-CM

## 2024-05-05 DIAGNOSIS — E559 Vitamin D deficiency, unspecified: Secondary | ICD-10-CM

## 2024-05-05 NOTE — Progress Notes (Signed)
 Chief Complaint  Patient presents with   New Patient (Initial Visit)    Rm 14, NP, Alone, pain in feet, legs lower back, aching, some tv static sensation      ASSESSMENT AND PLAN  Janet Palmer is a 28 y.o. adult   Long history of whole body achy pain  Essentially normal neurological examination  Laboratory evaluations including CPK, inflammatory markers, TSH to rule out intrinsic muscle disease or other treatable etiology.  Continue with her pain management for medication adjustment  DIAGNOSTIC DATA (LABS, IMAGING, TESTING) - I reviewed patient records, labs, notes, testing and imaging myself where available.   MEDICAL HISTORY:  Janet Palmer is a 28 year old patient, seen in request by her primary care nurse practitioner   Lendia Nordmann for evaluation of body achy pain, initial evaluation was on May 05, 2024  History is obtained from the patient and review of electronic medical records. I personally reviewed pertinent available imaging films in PACS.   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  She reported constant body achy pain as long as she could remember, described intense, sometimes stabbing pain, lower extremity more than upper extremity, denies persistent sensory or motor deficit,  She was seen by rehab/pain management Dr.Raulkar on April 18, 2024, discussed further treatment option for her pain, previously has tried Cymbalta, gabapentin without helping, is consider starting Topamax , trigger point injection aqua therapy,   PHYSICAL EXAM:   Vitals:   05/05/24 1051  BP: 122/70  Weight: 251 lb (113.9 kg)  Height: 5' 3 (1.6 m)     Body mass index is 44.46 kg/m.  PHYSICAL EXAMNIATION:  Gen: NAD, conversant, well nourised, well groomed                     Cardiovascular: Regular rate rhythm, no peripheral edema, warm, nontender. Eyes: Conjunctivae clear without exudates or  hemorrhage Neck: Supple, no carotid bruits. Pulmonary: Clear to auscultation bilaterally   NEUROLOGICAL EXAM:  MENTAL STATUS: Speech/cognition: Obese, awake, alert, oriented to history taking and casual conversation CRANIAL NERVES: CN II: Visual fields are full to confrontation. Pupils are round equal and briskly reactive to light. CN III, IV, VI: extraocular movement are normal. No ptosis. CN V: Facial sensation is intact to light touch CN VII: Face is symmetric with normal eye closure  CN VIII: Hearing is normal to causal conversation. CN IX, X: Phonation is normal. CN XI: Head turning and shoulder shrug are intact  MOTOR: There is no pronator drift of out-stretched arms. Muscle bulk and tone are normal. Muscle strength is normal.  REFLEXES: Reflexes are 2+ and symmetric at the biceps, triceps, knees, and ankles. Plantar responses are flexor.  SENSORY: Intact to light touch, pinprick and vibratory sensation are intact in fingers and toes.  COORDINATION: There is no trunk or limb dysmetria noted.  GAIT/STANCE: Posture is normal. Gait is steady    REVIEW OF SYSTEMS:  Full 14 system review of systems performed and notable only for as above All other review of systems were negative.   ALLERGIES: Allergies  Allergen Reactions   Amoxicillin    Lamictal [Lamotrigine] Dermatitis    Mannie Louder syndrome   Motrin [Ibuprofen]    Other Other (See Comments)    Birth Control causes suicidal tendencies   Soy Allergy (Obsolete) Nausea And Vomiting    Other Reaction(s): GI Intolerance    HOME MEDICATIONS: Current Outpatient Medications  Medication Sig Dispense Refill   ARIPiprazole  (ABILIFY ) 5 MG tablet Take 1 tablet (5 mg total) by mouth daily. 90 tablet 1   Cholecalciferol (VITAMIN D3) 125 MCG (5000 UT) CAPS Take 1 capsule (5,000 Units total) by mouth daily.     diazepam (VALIUM) 5 MG tablet  (Patient taking differently: Take 5 mg by mouth as directed. Prior to pelvic  exams per patient)     hydrOXYzine (ATARAX) 10 MG tablet Take by mouth. (Patient taking differently: Take 10 mg by mouth as directed. For anxiety per patient)     lisdexamfetamine (VYVANSE ) 30 MG capsule Take 1 capsule (30 mg total) by mouth every morning. 30 capsule 0   metFORMIN  (GLUCOPHAGE -XR) 500 MG 24 hr tablet Take 1 tablet (500 mg total) by mouth 2 (two) times daily with a meal. 90 tablet 1   medroxyPROGESTERone  (PROVERA ) 10 MG tablet If no period > 90 days, take one tablet daily x 10 days     semaglutide -weight management (WEGOVY ) 0.25 MG/0.5ML SOAJ SQ injection Inject 0.25 mg into the skin once a week. 2 mL 0   topiramate  (TOPAMAX ) 25 MG tablet Take 1 tablet (25 mg total) by mouth at bedtime. (Patient not taking: Reported on 05/03/2024) 90 tablet 3   No current facility-administered medications for this visit.    PAST MEDICAL HISTORY: Past Medical History:  Diagnosis Date   ADHD    Allergy    Anxiety    B12 deficiency    Back pain    Bipolar 2 disorder (HCC)    Constipation    Depression    Eating disorder    Fibromyalgia    Heartburn    Joint pain    OCD (obsessive compulsive disorder)    PCOS (polycystic ovarian syndrome)    Prediabetes    PTSD (post-traumatic stress disorder)    Vitamin D  deficiency     PAST SURGICAL HISTORY: Past Surgical History:  Procedure Laterality Date   arm fracture Left 2006   FRACTURE SURGERY     WISDOM TOOTH EXTRACTION  2017    FAMILY HISTORY: Family History  Problem Relation Age of Onset   Diabetes Mother    Depression Mother    Alcohol abuse Mother    Anxiety disorder Mother    Obesity Mother    Anxiety disorder Father    Thyroid  disease Father    Hypertension Father    Hyperlipidemia Father    Stroke Father    Sleep apnea Father    OCD Brother    Depression Brother    Cancer Maternal Grandfather        pancreatic   Hypertension Maternal Grandfather    Diabetes Maternal Grandmother    Cancer Maternal Grandmother         lung with METS   Depression Maternal Grandmother     SOCIAL HISTORY: Social History   Socioeconomic History   Marital status: Single    Spouse name: Not on file   Number of children: Not on file   Years of education: Not on file   Highest education level: Bachelor's degree (e.g., BA, AB, BS)  Occupational History   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[employer]  Tobacco Use   Smoking status: Never   Smokeless tobacco: Never  Vaping Use   Vaping status: Never Used  Substance and Sexual Activity   Alcohol use: Not Currently    Comment: occasional   Drug use: Never   Sexual activity: Not Currently    Partners: Female, Female  Birth control/protection: Other-see comments    Comment: Lack of penetrative sex  Other Topics Concern   Not on file  Social History Narrative   Right handed   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   Caffeine- occasional but not daily   Social Drivers of Health   Financial Resource Strain: Medium Risk (10/19/2023)   Overall Financial Resource Strain (CARDIA)    Difficulty of Paying Living Expenses: Somewhat hard  Food Insecurity: No Food Insecurity (10/19/2023)   Hunger Vital Sign    Worried About Running Out of Food in the Last Year: Never true    Ran Out of Food in the Last Year: Never true  Transportation Needs: No Transportation Needs (10/19/2023)   PRAPARE - Administrator, Civil Service (Medical): No    Lack of Transportation (Non-Medical): No  Physical Activity: Insufficiently Active (10/19/2023)   Exercise Vital Sign    Days of Exercise per Week: 2 days    Minutes of Exercise per Session: 20 min  Stress: Stress Concern Present (10/19/2023)   [employer] of Occupational Health - Occupational Stress Questionnaire    Feeling of Stress : Rather much  Social Connections: Socially Isolated (10/19/2023)   Social Connection and Isolation Panel    Frequency of Communication with Friends and Family: Three times a week    Frequency of  Social Gatherings with Friends and Family: Twice a week    Attends Religious Services: Never    Database administrator or Organizations: No    Attends Engineer, structural: Not on file    Marital Status: Never married  Intimate Partner Violence: Not on file      Modena Callander, M.D. Ph.D.  [facility] [address], [address] [place], [place] [address] Ph: [phone] Fax: [phone]  CC:  Lendia Boby CROME, NP-C [address] [place],  [place] [address]  Lendia Boby CROME, NP-C

## 2024-05-06 LAB — CK: Total CK: 32 U/L (ref 32–182)

## 2024-05-06 LAB — SEDIMENTATION RATE: Sed Rate: 13 mm/h (ref 0–32)

## 2024-05-06 LAB — ANA W/REFLEX IF POSITIVE: Anti Nuclear Antibody (ANA): NEGATIVE

## 2024-05-06 LAB — C-REACTIVE PROTEIN: CRP: 13 mg/L — ABNORMAL HIGH (ref 0–10)

## 2024-05-09 ENCOUNTER — Telehealth (INDEPENDENT_AMBULATORY_CARE_PROVIDER_SITE_OTHER): Admitting: Psychology

## 2024-05-09 DIAGNOSIS — F909 Attention-deficit hyperactivity disorder, unspecified type: Secondary | ICD-10-CM | POA: Diagnosis not present

## 2024-05-09 DIAGNOSIS — F319 Bipolar disorder, unspecified: Secondary | ICD-10-CM | POA: Diagnosis not present

## 2024-05-09 DIAGNOSIS — F5089 Other specified eating disorder: Secondary | ICD-10-CM | POA: Diagnosis not present

## 2024-05-09 DIAGNOSIS — F429 Obsessive-compulsive disorder, unspecified: Secondary | ICD-10-CM | POA: Diagnosis not present

## 2024-05-09 NOTE — Progress Notes (Signed)
 Office: [phone]  /  Fax: [phone]    Date: May 09, 2024  Appointment Start Time: 9:00am Duration: 35 minutes Provider: Wyatt Fire, Psy.D. Type of Session: Individual Therapy  Location of Patient: Home (private location) Location of Provider: Provider's Home (private office) Type of Contact: Telepsychological Visit via MyChart Video Visit  Session Content: Janet Palmer is a 28 y.o. adult presenting for a follow-up appointment to address the previously established treatment goal of increasing coping skills.Today's appointment was a telepsychological visit. Janet Palmer provided verbal consent for today's telepsychological appointment and Janet Palmer is aware Janet Palmer is responsible for securing confidentiality on Janet Palmer's end of the session. Prior to proceeding with today's appointment, Janet Palmer's physical location at the time of this appointment was obtained as well a phone number Janet Palmer could be reached at in the event of technical difficulties. Janet Palmer and this provider participated in today's telepsychological service.   This provider conducted a brief check-in. Janet Palmer reported, I've been working a lot. She continues to interview for another job and she received refills for her rxs. Notably, she is going to the beach for a family trip today. Additionally, Janet Palmer shared about her recent OV with Dr. [place] and eating habits. She described an increase in eating regularly throughout the day and also tracking food intake. She explained she continues to struggle with eating snacks between 12-5pm. Further explored. She agreed to place tuna packets and protein bars in her bags, and was observed placing snacks in her bags during today's appointment to avoid forgetting taking snacks with her. She further agreed to replenish her bags with snacks for on the go every Wednesday before bed. Remainder of today's appointment focused further on body image. Reviewed factors that contribute to her self-esteem other than body-image,  and how she can build upon these other areas of her life. During the appointment, Janet Palmer was asked to create a visual representation of the various aspects of her self-esteem. Associated thoughts and feelings were processed. Between now and the next appointment with this provider, Janet Palmer agreed to focus on resourcefulness to build upon further. Overall, Janet Palmer was receptive to today's appointment as evidenced by openness to sharing, responsiveness to feedback, and willingness to continue engaging in learned skills.  Mental Status Examination:  Appearance: neat Behavior: appropriate to circumstances Mood: neutral Affect: mood congruent Speech: WNL Eye Contact: appropriate Psychomotor Activity: WNL Gait: unable to assess Thought Process: linear, logical, and goal directed and denies suicidal, homicidal, and self-harm ideation, plan and intent  Thought Content/Perception: no hallucinations, delusions, bizarre thinking or behavior endorsed or observed Orientation: AAOx4 Memory/Concentration: intact Insight: fair Judgment: fair  Interventions:  Conducted a brief chart review Conducted a risk assessment Provided empathic reflections and validation Reviewed content from the previous session Provided positive reinforcement Employed supportive psychotherapy interventions to facilitate reduced distress and to improve coping skills with identified stressors Employed acceptance and commitment interventions to emphasize mindfulness, acceptance without struggle, as well as value guided actions  DSM-5 Diagnosis(es): F50.89 Other Specified Feeding or Eating Disorder, Emotional Eating Behaviors, F90.9 Unspecified Attention-Deficit/Hyperactivity Disorder , 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 9 Unspecified Bipolar and Related Disorder   Treatment Goal & Progress: During the initial appointment with this provider, the following treatment goal was established: increase  coping skills. Janet Palmer has demonstrated progress in Janet Palmer's goal as evidenced by increased awareness of hunger patterns and willingness to implement discussed strategies to improve adherence to clinic goals.   Plan: The next appointment is scheduled for 05/22/2024 at 12pm, which will be  via MyChart Video Visit. The next session will focus on working towards the established treatment goal. Janet Palmer will continue with Janet Palmer's primary therapist and psychiatric provider.    Wyatt Fire, PsyD

## 2024-05-11 DIAGNOSIS — H6123 Impacted cerumen, bilateral: Secondary | ICD-10-CM | POA: Diagnosis not present

## 2024-05-12 ENCOUNTER — Ambulatory Visit: Admitting: Neurology

## 2024-05-14 NOTE — Assessment & Plan Note (Signed)
 Working in being mindful of food choices and trying to get one nutritious food in daily.  She is planning to start GLP 1.  Will follow up at next appointment concerning possible side effects if any.

## 2024-05-14 NOTE — Assessment & Plan Note (Signed)
 Did well on prescription strength Vitamin D .  No nausea, vomiting or muscle weakness.  Continue OTC

## 2024-05-17 DIAGNOSIS — F4312 Post-traumatic stress disorder, chronic: Secondary | ICD-10-CM | POA: Diagnosis not present

## 2024-05-19 ENCOUNTER — Other Ambulatory Visit (HOSPITAL_COMMUNITY): Payer: Self-pay | Admitting: Psychiatry

## 2024-05-19 DIAGNOSIS — Z124 Encounter for screening for malignant neoplasm of cervix: Secondary | ICD-10-CM | POA: Diagnosis not present

## 2024-05-19 DIAGNOSIS — Z0000 Encounter for general adult medical examination without abnormal findings: Secondary | ICD-10-CM | POA: Diagnosis not present

## 2024-05-19 NOTE — Telephone Encounter (Signed)
 Call for appt

## 2024-05-22 ENCOUNTER — Telehealth (INDEPENDENT_AMBULATORY_CARE_PROVIDER_SITE_OTHER): Admitting: Psychology

## 2024-05-22 DIAGNOSIS — F909 Attention-deficit hyperactivity disorder, unspecified type: Secondary | ICD-10-CM

## 2024-05-22 DIAGNOSIS — F319 Bipolar disorder, unspecified: Secondary | ICD-10-CM

## 2024-05-22 DIAGNOSIS — F5089 Other specified eating disorder: Secondary | ICD-10-CM

## 2024-05-22 DIAGNOSIS — F429 Obsessive-compulsive disorder, unspecified: Secondary | ICD-10-CM

## 2024-05-22 NOTE — Progress Notes (Signed)
  Office: [phone]  /  Fax: [phone]    Date: May 22, 2024  Appointment Start Time: 12:06pm Duration: 28 minutes Provider: Wyatt Fire, Psy.D. Type of Session: Individual Therapy  Location of Patient: Home (private location) Location of Provider: Provider's Home (private office) Type of Contact: Telepsychological Visit via MyChart Video Visit  Session Content: Janet Palmer is a 28 y.o. adult presenting for a follow-up appointment to address the previously established treatment goal of increasing coping skills.Today's appointment was a telepsychological visit. Janet Palmer provided verbal consent for today's telepsychological appointment and Janet Palmer is aware Janet Palmer is responsible for securing confidentiality on Janet Palmer's end of the session. Prior to proceeding with today's appointment, Janet Palmer's physical location at the time of this appointment was obtained as well a phone number Janet Palmer could be reached at in the event of technical difficulties. Janet Palmer and this provider participated in today's telepsychological service.   This provider conducted a brief check-in. Janet Palmer shared about her recent family trip to the beach, noting she started Wegovy  while on vacation. While she is no longer experiencing side effects from the rx, she is not noticing any changes. She was encouraged to discuss further with her prescribing provider. She agreed. [name] acknowledged continued challenges with consistently eating, but also described some improvement. Reviewed content from the last appointment. Janet Palmer shared examples regarding increasing resourcefulness. Associated thoughts and feelings were processed. She reflected thinking less negatively regarding her body-image. Remainder of today's appointment focused on developing a more well-rounded body image by focusing on her body as a whole, rather than just appearance. Overall, Janet Palmer was receptive to today's appointment as evidenced by openness to sharing, responsiveness to feedback,  and willingness to continue engaging in learned skills.  Mental Status Examination:  Appearance: neat Behavior: appropriate to circumstances Mood: neutral Affect: mood congruent Speech: WNL Eye Contact: appropriate Psychomotor Activity: WNL Gait: unable to assess Thought Process: linear, logical, and goal directed and no evidence or endorsement of suicidal, homicidal, and self-harm ideation, plan and intent  Thought Content/Perception: no hallucinations, delusions, bizarre thinking or behavior endorsed or observed Orientation: AAOx4 Memory/Concentration: intact Insight: fair Judgment: fair  Interventions:  Conducted a brief chart review Provided empathic reflections and validation Reviewed content from the previous session Provided positive reinforcement Employed supportive psychotherapy interventions to facilitate reduced distress and to improve coping skills with identified stressors Employed acceptance and commitment interventions to emphasize mindfulness, acceptance without struggle, as well as value guided actions  DSM-5 Diagnosis(es): F50.89 Other Specified Feeding or Eating Disorder, Emotional Eating Behaviors, F90.9 Unspecified Attention-Deficit/Hyperactivity Disorder , 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 9 Unspecified Bipolar and Related Disorder   Treatment Goal & Progress: During the initial appointment with this provider, the following treatment goal was established: increase coping skills. Janet Palmer has demonstrated progress in Janet Palmer's goal as evidenced by increased awareness of hunger patterns and willingness to implement discussed strategies to improve adherence to clinic goals.   Plan: The next appointment is scheduled for 05/30/2024 at 2:30pm, which will be via MyChart Video Visit. The next session will focus on working towards the established treatment goal. Janet Palmer will continue with Janet Palmer's primary therapist and psychiatric provider.     Wyatt Fire, PsyD

## 2024-05-26 ENCOUNTER — Other Ambulatory Visit (INDEPENDENT_AMBULATORY_CARE_PROVIDER_SITE_OTHER): Payer: Self-pay | Admitting: Family Medicine

## 2024-05-26 DIAGNOSIS — R7303 Prediabetes: Secondary | ICD-10-CM

## 2024-05-30 ENCOUNTER — Other Ambulatory Visit (HOSPITAL_COMMUNITY): Payer: Self-pay | Admitting: Psychiatry

## 2024-05-30 ENCOUNTER — Telehealth (INDEPENDENT_AMBULATORY_CARE_PROVIDER_SITE_OTHER): Admitting: Psychology

## 2024-05-30 ENCOUNTER — Ambulatory Visit: Admitting: Physical Medicine and Rehabilitation

## 2024-05-30 ENCOUNTER — Telehealth (HOSPITAL_COMMUNITY): Payer: Self-pay

## 2024-05-30 DIAGNOSIS — F429 Obsessive-compulsive disorder, unspecified: Secondary | ICD-10-CM

## 2024-05-30 DIAGNOSIS — F5089 Other specified eating disorder: Secondary | ICD-10-CM

## 2024-05-30 DIAGNOSIS — F909 Attention-deficit hyperactivity disorder, unspecified type: Secondary | ICD-10-CM | POA: Diagnosis not present

## 2024-05-30 DIAGNOSIS — F319 Bipolar disorder, unspecified: Secondary | ICD-10-CM | POA: Diagnosis not present

## 2024-05-30 MED ORDER — LISDEXAMFETAMINE DIMESYLATE 30 MG PO CAPS: 30.0000 mg | ORAL_CAPSULE | ORAL | 0 refills | Status: DC

## 2024-05-30 NOTE — Progress Notes (Signed)
  Office: [phone]  /  Fax: [phone]    Date: May 30, 2024  Appointment Start Time: 2:30pm Duration: 27 minutes Provider: Wyatt Fire, Psy.D. Type of Session: Individual Therapy  Location of Patient: Home (private location) Location of Provider: Provider's Home (private office) Type of Contact: Telepsychological Visit via MyChart Video Visit  Session Content: Janet Palmer is a 28 y.o. adult presenting for a follow-up appointment to address the previously established treatment goal of increasing coping skills. Today's appointment was a telepsychological visit. Janet Palmer provided verbal consent for today's telepsychological appointment and Janet Palmer is aware Janet Palmer is responsible for securing confidentiality on Janet Palmer's end of the session. Prior to proceeding with today's appointment, Janet Palmer's physical location at the time of this appointment was obtained as well a phone number Janet Palmer could be reached at in the event of technical difficulties. Janet Palmer and this provider participated in today's telepsychological service.   This provider conducted a brief check-in. Janet Palmer shared she has a second interview for a job tomorrow. Regarding eating habits, she stated she can't tell how things are going. She disclosed having very intense cravings lately. Further explored and processed. Janet Palmer stated a belief limited water intake is likely playing a role. Psychoeducation provided regarding the importance of water intake, specifically impact on hunger and subsequent weight loss. Remainder of today's appointment focused on increasing water intake. Psychoeducation provided regarding habit stacking. Focused on making stacks to help Janet Palmer increase water intake. Overall, Janet Palmer was receptive to today's appointment as evidenced by openness to sharing, responsiveness to feedback, and willingness to implement discussed strategies .  Mental Status Examination:  Appearance: neat Behavior: appropriate to circumstances Mood:  neutral Affect: mood congruent Speech: WNL Eye Contact: appropriate Psychomotor Activity: WNL Gait: unable to assess Thought Process: linear, logical, and goal directed and no evidence or endorsement of suicidal, homicidal, and self-harm ideation, plan and intent  Thought Content/Perception: no hallucinations, delusions, bizarre thinking or behavior endorsed or observed Orientation: AAOx4 Memory/Concentration: intact Insight: fair Judgment: fair  Interventions:  Conducted a brief chart review Provided empathic reflections and validation Provided positive reinforcement Employed supportive psychotherapy interventions to facilitate reduced distress and to improve coping skills with identified stressors Engaged patient in problem solving Psychoeducation provided regarding habit stacks  DSM-5 Diagnosis(es): F50.89 Other Specified Feeding or Eating Disorder, Emotional Eating Behaviors, F90.9 Unspecified Attention-Deficit/Hyperactivity Disorder , 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 9 Unspecified Bipolar and Related Disorder   Treatment Goal & Progress: During the initial appointment with this provider, the following treatment goal was established: increase coping skills. Janet Palmer has demonstrated progress in Janet Palmer's goal as evidenced by increased awareness of hunger patterns and willingness to implement discussed strategies to improve adherence to clinic goals.   Plan: The next appointment is scheduled for 06/13/2024 at 9am, which will be via MyChart Video Visit. The next session will focus on working towards the established treatment goal. Janet Palmer will continue with Janet Palmer's primary therapist and psychiatric provider.        Wyatt Fire, PsyD

## 2024-05-30 NOTE — Telephone Encounter (Signed)
 Pt called in requesting a refill on  lisdexamfetamine (VYVANSE ) 30 MG capsule sent to [facility] on [address]. Pt scheduled 06/14/24. Please advise.

## 2024-05-30 NOTE — Telephone Encounter (Signed)
Called no answer left vm to check pharmacy

## 2024-05-30 NOTE — Telephone Encounter (Signed)
 sent

## 2024-05-31 ENCOUNTER — Ambulatory Visit (INDEPENDENT_AMBULATORY_CARE_PROVIDER_SITE_OTHER): Admitting: Family Medicine

## 2024-05-31 ENCOUNTER — Encounter (INDEPENDENT_AMBULATORY_CARE_PROVIDER_SITE_OTHER): Payer: Self-pay | Admitting: Family Medicine

## 2024-05-31 VITALS — BP 116/81 | HR 78 | Temp 97.9°F | Ht 64.0 in | Wt 244.0 lb

## 2024-05-31 DIAGNOSIS — R7303 Prediabetes: Secondary | ICD-10-CM

## 2024-05-31 DIAGNOSIS — Z6841 Body Mass Index (BMI) 40.0 and over, adult: Secondary | ICD-10-CM

## 2024-05-31 MED ORDER — METFORMIN HCL ER 500 MG PO TB24: 500.0000 mg | ORAL_TABLET | Freq: Every day | ORAL | 0 refills | Status: DC

## 2024-05-31 MED ORDER — WEGOVY 0.5 MG/0.5ML ~~LOC~~ SOAJ: 0.5000 mg | SUBCUTANEOUS | 0 refills | Status: DC

## 2024-05-31 NOTE — Progress Notes (Unsigned)
 SUBJECTIVE:  Chief Complaint: Obesity  Interim History: Patient has an interview at [facility] today.  She is hopeful this will lead to something.  August was good and she worked quite a bit.  She has some plans for the future she is looking forward to.  She started the Wegovy  and feels this is going well.  Hasn't noticed a big difference in her hunger levels but can't eat as much as she used to.  This Friday she is going to a concert in [place] and next weekend she is going to a convention in [place]. She is rooming with 2 bedroom hotel room. Her partner is visiting in October.  She is getting surgery in September- hysteroscopy, D&C, Pap smear and IUD insertion.   Lynley is here to discuss MJ's progress with MJ's obesity treatment plan. MJ is on the practicing portion control and making smarter food choices, such as increasing vegetables and decreasing simple carbohydrates and states MJ is following MJ's eating plan approximately 60 % of the time. MJ states MJ is walking a lot.   OBJECTIVE: Visit Diagnoses: Problem List Items Addressed This Visit       Other   Severe obesity (BMI >= 40) (HCC)   Relevant Medications   metFORMIN  (GLUCOPHAGE -XR) 500 MG 24 hr tablet   semaglutide -weight management (WEGOVY ) 0.5 MG/0.5ML SOAJ SQ injection   Prediabetes   Relevant Medications   metFORMIN  (GLUCOPHAGE -XR) 500 MG 24 hr tablet    Vitals Temp: 97.9 F (36.6 C) BP: 116/81 Pulse Rate: 78 SpO2: 97 %   Anthropometric Measurements Height: 5' 4 (1.626 m) Weight: 244 lb (110.7 kg) BMI (Calculated): 41.86 Weight at Last Visit: 245 lb Weight Lost Since Last Visit: 1 Weight Gained Since Last Visit: 0 Starting Weight: 245 lb Total Weight Loss (lbs): 1 lb (0.454 kg)   Body Composition  Body Fat %: 45.4 % Fat Mass (lbs): 111 lbs Muscle Mass (lbs): 126.6 lbs Total Body Water (lbs): 89.8 lbs Visceral Fat Rating : 11   Other Clinical Data Fasting: no Labs: no Today's Visit #: 10 Starting  Date: 08/24/23 Comments: [place]     ASSESSMENT AND PLAN: Assessment & Plan Prediabetes Patient has been on GLP1 therapy for a about a week and isn't having much in terms of side effects.  She is getting more control in terms of carb intake.  Will fast for repeat labs at next appointment.  Continue portion control and informed choices.  Needs a refill of metformin  today- no change in dose. Severe obesity (BMI >= 40) (HCC)  BMI 40.0-44.9, adult (HCC)    Diet: Rebeka is currently in the action stage of change. As such, MJ's goal is to continue with weight loss efforts and has agreed to practicing portion control and making smarter food choices, such as increasing vegetables and decreasing simple carbohydrates.   Exercise:  For substantial health benefits, adults should do at least 150 minutes (2 hours and 30 minutes) a week of moderate-intensity, or 75 minutes (1 hour and 15 minutes) a week of vigorous-intensity aerobic physical activity, or an equivalent combination of moderate- and vigorous-intensity aerobic activity. Aerobic activity should be performed in episodes of at least 10 minutes, and preferably, it should be spread throughout the week.  Behavior Modification:  We discussed the following Behavioral Modification Strategies today: increasing lean protein intake, decreasing simple carbohydrates, increasing vegetables, meal planning and cooking strategies, and planning for success. We discussed various medication options to help Darbie with MJ's weight loss efforts and  we both agreed to increase Wegovy  to 0.5mg  weekly  Return in about 4 weeks (around 06/28/2024).   MJ was informed of the importance of frequent follow up visits to maximize MJ's success with intensive lifestyle modifications for MJ's multiple health conditions.  Attestation Statements:   Reviewed by clinician on day of visit: allergies, medications, problem list, medical history, surgical history, family history, social  history, and previous encounter notes.     Adelita Cho, MD

## 2024-06-01 NOTE — Assessment & Plan Note (Addendum)
 Patient has been on GLP1 therapy for a about a week and isn't having much in terms of side effects.  She is getting more control in terms of carb intake.  Will fast for repeat labs at next appointment.  Continue portion control and informed choices.  Needs a refill of metformin  today- no change in dose.

## 2024-06-06 ENCOUNTER — Telehealth: Payer: Self-pay | Admitting: Family Medicine

## 2024-06-06 NOTE — Telephone Encounter (Signed)
 Copied from CRM [id number]. Topic: Clinical - Medication Refill >> Jun 06, 2024  8:36 AM Zy'onna H wrote: Medication: COVID - 19 Vaccine Prescription request.  **Patient stated her pharmacy told her she will have to get a Rx for this vaccine as she is a high risk patient and would like to get the injection ASAP.   Has the patient contacted their pharmacy? Yes (Agent: If no, request that the patient contact the pharmacy for the refill. If patient does not wish to contact the pharmacy document the reason why and proceed with request.) (Agent: If yes, when and what did the pharmacy advise?)  This is the patient's preferred pharmacy:   [id number] [address], [place] - [address] AT [facility] [address] [place] [address] Phone: [phone] Fax: [phone]  Is this the correct pharmacy for this prescription? Yes If no, delete pharmacy and type the correct one.   Has the prescription been filled recently? No  Is the patient out of the medication? Yes  Has the patient been seen for an appointment in the last year OR does the patient have an upcoming appointment? Yes  Can we respond through MyChart? Yes  Agent: Please be advised that Rx refills may take up to 3 business days. We ask that you follow-up with your pharmacy.

## 2024-06-06 NOTE — Telephone Encounter (Signed)
 Called and gave pt statement:   At this time, your provider is unable to issue a prescription for the COVID-19 vaccine. We are actively developing protocols to ensure you receive the appropriate documentation required for vaccination. We kindly ask that you check back with our office in 14 business days for an update on the availability of this documentation.  Pt states by the looks of the website, the pharmacy has the vaccine in stock. When she checks back, she will verify where to send

## 2024-06-07 ENCOUNTER — Ambulatory Visit (INDEPENDENT_AMBULATORY_CARE_PROVIDER_SITE_OTHER): Admitting: Family Medicine

## 2024-06-13 ENCOUNTER — Telehealth (INDEPENDENT_AMBULATORY_CARE_PROVIDER_SITE_OTHER): Admitting: Psychology

## 2024-06-13 DIAGNOSIS — F429 Obsessive-compulsive disorder, unspecified: Secondary | ICD-10-CM | POA: Diagnosis not present

## 2024-06-13 DIAGNOSIS — F319 Bipolar disorder, unspecified: Secondary | ICD-10-CM

## 2024-06-13 DIAGNOSIS — F909 Attention-deficit hyperactivity disorder, unspecified type: Secondary | ICD-10-CM

## 2024-06-13 DIAGNOSIS — F5089 Other specified eating disorder: Secondary | ICD-10-CM

## 2024-06-13 NOTE — Progress Notes (Signed)
 Office: [phone]  /  Fax: [phone]    Date: June 13, 2024  Appointment Start Time: 8:58am Duration: 30 minutes Provider: Wyatt Fire, Psy.D. Type of Session: Individual Therapy  Location of Patient: Home (private location) Location of Provider: Provider's Home (private office) Type of Contact: Telepsychological Visit via MyChart Video Visit  Session Content: Janet Palmer is a 28 y.o. adult presenting for a follow-up appointment to address the previously established treatment goal of increasing coping skills. Today's appointment was a telepsychological visit. Janet Palmer provided verbal consent for today's telepsychological appointment and Janet Palmer is aware Janet Palmer is responsible for securing confidentiality on Janet Palmer's end of the session. Prior to proceeding with today's appointment, Janet Palmer's physical location at the time of this appointment was obtained as well a phone number Janet Palmer could be reached at in the event of technical difficulties. Janet Palmer and this provider participated in today's telepsychological service.   This provider conducted a brief check-in. Janet Palmer shared she obtained a second job and attended an [employer] with her friends over the weekend. Regarding eating habits, she noted a reduction in portion size; however, she may go long periods without eating. Further explored. Discussed the importance of eating regularly and the benefit of eating smaller/frequent meals. She agreed to set reminders on her phone for every 2-3 hours. Notably, she shared a reduction in emotional eating behaviors, which she believes is secondary to current medication. While there is a reduction in food noise and engagement in emotional eating behaviors due to medication, remainder of today's appointment focused on triggers for emotional eating behaviors to help with identification and coping. Psychoeducation regarding triggers for emotional eating was provided. Janet Palmer was provided a handout, and encouraged to utilize  the handout between now and the next appointment to increase awareness of triggers and frequency. Janet Palmer agreed. This provider also discussed behavioral strategies for specific triggers, such as placing the utensil down when conversing to avoid mindless eating. Janet Palmer provided verbal consent during today's appointment for this provider to send a handout about triggers via e-mail. Overall, Janet Palmer was receptive to today's appointment as evidenced by openness to sharing, responsiveness to feedback, and willingness to explore triggers for emotional eating.  Mental Status Examination:  Appearance: neat Behavior: appropriate to circumstances Mood: neutral Affect: mood congruent Speech: normal in rate, volume, and tone Eye Contact: intermittent  Psychomotor Activity: WNL Gait: unable to assess Thought Process: linear, logical, and goal directed and denies suicidal, homicidal, and self-harm ideation, plan and intent  Thought Content/Perception: no hallucinations, delusions, bizarre thinking or behavior endorsed or observed Orientation: AAOx4 Memory/Concentration: intact Insight: fair Judgment: fair  Interventions:  Conducted a brief chart review Conducted a risk assessment Provided empathic reflections and validation Provided positive reinforcement Employed supportive psychotherapy interventions to facilitate reduced distress and to improve coping skills with identified stressors Psychoeducation provided regarding triggers for emotional eating behaviors  DSM-5 Diagnosis(es): F50.89 Other Specified Feeding or Eating Disorder, Emotional Eating Behaviors, F90.9 Unspecified Attention-Deficit/Hyperactivity Disorder , 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 9 Unspecified Bipolar and Related Disorder   Treatment Goal & Progress: During the initial appointment with this provider, the following treatment goal was established: increase coping skills. Janet Palmer has demonstrated  progress in Janet Palmer's goal as evidenced by increased awareness of hunger patterns and willingness to implement discussed strategies to improve adherence to clinic goals.    Plan: The next appointment is scheduled for 06/27/2024 at 8am, which will be via MyChart Video Visit. The next session will focus on working towards the established treatment  goal. Janet Palmer will continue with Janet Palmer's primary therapist and psychiatric provider.      Wyatt Fire, PsyD

## 2024-06-14 ENCOUNTER — Telehealth (HOSPITAL_COMMUNITY): Admitting: Psychiatry

## 2024-06-14 ENCOUNTER — Encounter (HOSPITAL_COMMUNITY): Payer: Self-pay | Admitting: Psychiatry

## 2024-06-14 DIAGNOSIS — F3181 Bipolar II disorder: Secondary | ICD-10-CM | POA: Diagnosis not present

## 2024-06-14 DIAGNOSIS — F900 Attention-deficit hyperactivity disorder, predominantly inattentive type: Secondary | ICD-10-CM

## 2024-06-14 MED ORDER — LISDEXAMFETAMINE DIMESYLATE 40 MG PO CAPS: 40.0000 mg | ORAL_CAPSULE | ORAL | 0 refills | Status: AC

## 2024-06-14 MED ORDER — ARIPIPRAZOLE 5 MG PO TABS: 7.5000 mg | ORAL_TABLET | Freq: Every day | ORAL | 1 refills | Status: AC

## 2024-06-14 NOTE — Progress Notes (Signed)
 Virtual Visit via Video Note  I connected with Janet Palmer on 06/14/24 at  8:40 AM EDT by a video enabled telemedicine application and verified that I am speaking with the correct person using two identifiers.  Location: Patient: home Provider: office   I discussed the limitations of evaluation and management by telemedicine and the availability of in person appointments. The patient expressed understanding and agreed to proceed.      I discussed the assessment and treatment plan with the patient. The patient was provided an opportunity to ask questions and all were answered. The patient agreed with the plan and demonstrated an understanding of the instructions.   The patient was advised to call back or seek an in-person evaluation if the symptoms worsen or if the condition fails to improve as anticipated.  I provided 20 minutes of non-face-to-face time during this encounter.   Barnie Gull, MD  [facility] MD/PA/NP OP Progress Note  06/14/2024 8:53 AM Janet Palmer  MRN:  [id number]  Chief Complaint:  Chief Complaint  Patient presents with   Depression   ADHD   Follow-up   HPI: This patient is a 28 year old nonbinary person who lives alone in [place].  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   The patient states that they have dealt with depression and hypomanic symptoms since about age 28 They attempted suicide by hanging but claims they were not really serious at that time and was just trying to get help.  They have been seeing psychiatrist and therapist ever since. They have had a long history of depressed mood punctuated by hypomanic episodes as well as anxiety.   They state that over the last couple of months his depression has become worse.  They endorse anhedonia low mood not enjoying much in their life.  Some difficulty sleeping.  Variable appetite.  They endorse thoughts of wanting to die when things get difficult although they have never hurt themself and do not  plan to.  They have also recently been diagnosed with ADD and has had a lot of trouble with focus attention span difficulty completing tasks as well as poor motivation.  They tried Strattera but with no effect.  The Intuniv  does help to some degree that they take at bedtime.  They also take Abilify  which has helped with the mood swings and depression.   The patient has tried antidepressants in the past.  The most recent 1 was Zoloft which caused hypomanic symptoms.  They are supposed to be taking nortriptyline for chronic back pain but are afraid to start it worrying about the causing of suicidal ideation.  They also tend to have OCD and ruminate and second-guess themself a good deal.  They do not use alcohol drugs cigarettes vaping.  Not currently sexually active.  They do state that the job is very stressful and do not enjoy the job.  They enjoy going to movies reading writing and going to the gym.  They are going to the  wellness clinic to work on healthier eating.  In the past they were restricting calories but never experienced bulimia  The patient returns for follow-up after about 5 months regarding depression mood swings and ADHD.  They state that mood has been stable.  This is despite the fact that the patient's mother is going through chemotherapy.  They state that right now their mother is stable.  They are also working to retail jobs.  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  They are having trouble with menstrual bleeding and are going to have a D&C.  The patient complains about more difficulty focusing and would like to go up a bit on the Vyvanse  which I think is reasonable.  They claim the Abilify  has been helping with the mood swings and denied depression anxiety thoughts of self-harm or suicide Visit Diagnosis:    ICD-10-CM   1. Bipolar 2 disorder (HCC)  F31.81     2. Attention deficit hyperactivity disorder, inattentive type  F90.0       Past  Psychiatric History: Long-term outpatient treatment. Last saw a nurse practitioner in psychiatry at [facility]   Past Medical History:  Past Medical History:  Diagnosis Date   ADHD    Allergy    Anxiety    B12 deficiency    Back pain    Bipolar 2 disorder (HCC)    Constipation    Depression    Eating disorder    Fibromyalgia    Heartburn    Joint pain    OCD (obsessive compulsive disorder)    PCOS (polycystic ovarian syndrome)    Prediabetes    PTSD (post-traumatic stress disorder)    Vitamin D  deficiency     Past Surgical History:  Procedure Laterality Date   arm fracture Left 2006   FRACTURE SURGERY     WISDOM TOOTH EXTRACTION  2017    Family Psychiatric History: See below  Family History:  Family History  Problem Relation Age of Onset   Diabetes Mother    Depression Mother    Alcohol abuse Mother    Anxiety disorder Mother    Obesity Mother    Anxiety disorder Father    Thyroid  disease Father    Hypertension Father    Hyperlipidemia Father    Stroke Father    Sleep apnea Father    OCD Brother    Depression Brother    Cancer Maternal Grandfather        pancreatic   Hypertension Maternal Grandfather    Diabetes Maternal Grandmother    Cancer Maternal Grandmother        lung with METS   Depression Maternal Grandmother     Social History:  Social History   Socioeconomic History   Marital status: Single    Spouse name: Not on file   Number of children: Not on file   Years of education: Not on file   Highest education level: Bachelor's degree (e.g., BA, AB, BS)  Occupational History   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[employer]  Tobacco Use   Smoking status: Never   Smokeless tobacco: Never  Vaping Use   Vaping status: Never Used  Substance and Sexual Activity   Alcohol use: Not Currently    Comment: occasional   Drug use: Never   Sexual activity: Not Currently    Partners: Female, Female    Birth control/protection:  Other-see comments    Comment: Lack of penetrative sex  Other Topics Concern   Not on file  Social History Narrative   Right handed   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   Caffeine- occasional but not daily   Social Drivers of Health   Financial Resource Strain: Medium Risk (10/19/2023)   Overall Financial Resource Strain (CARDIA)    Difficulty of Paying Living Expenses: Somewhat hard  Food Insecurity: No Food Insecurity (10/19/2023)   Hunger Vital Sign    Worried About Running Out of Food in the Last Year: Never true  Ran Out of Food in the Last Year: Never true  Transportation Needs: No Transportation Needs (10/19/2023)   PRAPARE - Administrator, Civil Service (Medical): No    Lack of Transportation (Non-Medical): No  Physical Activity: Insufficiently Active (10/19/2023)   Exercise Vital Sign    Days of Exercise per Week: 2 days    Minutes of Exercise per Session: 20 min  Stress: Stress Concern Present (10/19/2023)   [employer] of Occupational Health - Occupational Stress Questionnaire    Feeling of Stress : Rather much  Social Connections: Socially Isolated (10/19/2023)   Social Connection and Isolation Panel    Frequency of Communication with Friends and Family: Three times a week    Frequency of Social Gatherings with Friends and Family: Twice a week    Attends Religious Services: Never    Database administrator or Organizations: No    Attends Engineer, structural: Not on file    Marital Status: Never married    Allergies:  Allergies  Allergen Reactions   Amoxicillin    Lamictal [Lamotrigine] Dermatitis    Mannie Louder syndrome   Motrin [Ibuprofen]    Other Other (See Comments)    Birth Control causes suicidal tendencies   Soy Allergy (Obsolete) Nausea And Vomiting    Other Reaction(s): GI Intolerance    Metabolic Disorder Labs: Lab Results  Component Value Date   HGBA1C 6.0 (H) 08/24/2023   MPG 114 11/29/2014   Lab Results  Component Value  Date   PROLACTIN 25.1 (H) 07/06/2017   PROLACTIN 21.0 11/03/2016   Lab Results  Component Value Date   CHOL 188 08/24/2023   TRIG 96 08/24/2023   HDL 46 08/24/2023   CHOLHDL 2.6 11/29/2014   VLDL 9 11/29/2014   LDLCALC 124 (H) 08/24/2023   LDLCALC 76 11/29/2014   Lab Results  Component Value Date   TSH 2.490 08/24/2023   TSH 2.450 07/06/2017    Therapeutic Level Labs: No results found for: LITHIUM No results found for: VALPROATE No results found for: CBMZ  Current Medications: Current Outpatient Medications  Medication Sig Dispense Refill   lisdexamfetamine (VYVANSE ) 40 MG capsule Take 1 capsule (40 mg total) by mouth every morning. 30 capsule 0   lisdexamfetamine (VYVANSE ) 40 MG capsule Take 1 capsule (40 mg total) by mouth every morning. 30 capsule 0   lisdexamfetamine (VYVANSE ) 40 MG capsule Take 1 capsule (40 mg total) by mouth every morning. 30 capsule 0   ARIPiprazole  (ABILIFY ) 5 MG tablet Take 1.5 tablets (7.5 mg total) by mouth daily. 135 tablet 1   Cholecalciferol (VITAMIN D3) 125 MCG (5000 UT) CAPS Take 1 capsule (5,000 Units total) by mouth daily.     diazepam (VALIUM) 5 MG tablet  (Patient taking differently: Take 5 mg by mouth as directed. Prior to pelvic exams per patient)     hydrOXYzine (ATARAX) 10 MG tablet Take by mouth. (Patient taking differently: Take 10 mg by mouth as directed. For anxiety per patient)     medroxyPROGESTERone  (PROVERA ) 10 MG tablet If no period > 90 days, take one tablet daily x 10 days     metFORMIN  (GLUCOPHAGE -XR) 500 MG 24 hr tablet Take 1 tablet (500 mg total) by mouth daily with breakfast. 90 tablet 0   semaglutide -weight management (WEGOVY ) 0.5 MG/0.5ML SOAJ SQ injection Inject 0.5 mg into the skin once a week. 2 mL 0   topiramate  (TOPAMAX ) 25 MG tablet Take 1 tablet (25 mg total) by mouth  at bedtime. 90 tablet 3   No current facility-administered medications for this visit.     Musculoskeletal: Strength & Muscle Tone:  within normal limits Gait & Station: normal Patient leans: N/A  Psychiatric Specialty Exam: Review of Systems  Psychiatric/Behavioral:  Positive for decreased concentration.   All other systems reviewed and are negative.   There were no vitals taken for this visit.There is no height or weight on file to calculate BMI.  General Appearance: Casual and Fairly Groomed  Eye Contact:  Good  Speech:  Clear and Coherent  Volume:  Normal  Mood:  Euthymic  Affect:  Congruent  Thought Process:  Goal Directed  Orientation:  Full (Time, Place, and Person)  Thought Content: WDL   Suicidal Thoughts:  No  Homicidal Thoughts:  No  Memory:  Immediate;   Good Recent;   Good Remote;   Good  Judgement:  Good  Insight:  Good  Psychomotor Activity:  Normal  Concentration:  Concentration: Fair and Attention Span: Fair  Recall:  Good  Fund of Knowledge: Good  Language: Good  Akathisia:  No  Handed:  Right  AIMS (if indicated): not done  Assets:  Communication Skills Desire for Improvement Physical Health Resilience Social Support Talents/Skills Vocational/Educational  ADL's:  Intact  Cognition: WNL  Sleep:  Good   Screenings: GAD-7    Flowsheet Row Office Visit from 02/09/2024 in [facility]  Total GAD-7 Score 18   PHQ2-9    Flowsheet Row Office Visit from 04/18/2024 in [facility] Office Visit from 02/09/2024 in [facility] Video Visit from 09/18/2023 in [facility] Office Visit from 06/02/2023 in [facility] [place]  PHQ-2 Total Score 4 3 3 3   PHQ-9 Total Score 20 16 19 20    Flowsheet Row Video [facility]  C-SSRS RISK CATEGORY Error: Q3, 4, or 5 should not be populated when Q2 is No     Assessment and Plan: This patient is a 28 year old nonbinary  individual with long history of depression and hypomanic symptoms and ADD.  They state they are not focusing all that well so we will increase Vyvanse  from 30 to 40 mg every morning for ADD.  They will continue Abilify  7.5 mg daily for mood stabilization.  They will return to see me in 3 months  Collaboration of Care: Collaboration of Care: Primary Care Provider AEB notes are shared with PCP on the epic system  Patient/Guardian was advised Release of Information must be obtained prior to any record release in order to collaborate their care with an outside provider. Patient/Guardian was advised if they have not already done so to contact the registration department to sign all necessary forms in order for us  to release information regarding their care.   Consent: Patient/Guardian gives verbal consent for treatment and assignment of benefits for services provided during this visit. Patient/Guardian expressed understanding and agreed to proceed.    Barnie Gull, MD 06/14/2024, 8:53 AM

## 2024-06-15 DIAGNOSIS — Z01812 Encounter for preprocedural laboratory examination: Secondary | ICD-10-CM | POA: Diagnosis not present

## 2024-06-20 DIAGNOSIS — Z01419 Encounter for gynecological examination (general) (routine) without abnormal findings: Secondary | ICD-10-CM | POA: Diagnosis not present

## 2024-06-23 DIAGNOSIS — N939 Abnormal uterine and vaginal bleeding, unspecified: Secondary | ICD-10-CM | POA: Diagnosis not present

## 2024-06-23 DIAGNOSIS — N840 Polyp of corpus uteri: Secondary | ICD-10-CM | POA: Diagnosis not present

## 2024-06-23 DIAGNOSIS — E282 Polycystic ovarian syndrome: Secondary | ICD-10-CM | POA: Diagnosis not present

## 2024-06-23 DIAGNOSIS — N888 Other specified noninflammatory disorders of cervix uteri: Secondary | ICD-10-CM | POA: Diagnosis not present

## 2024-06-27 ENCOUNTER — Telehealth (INDEPENDENT_AMBULATORY_CARE_PROVIDER_SITE_OTHER): Admitting: Psychology

## 2024-06-27 DIAGNOSIS — F5089 Other specified eating disorder: Secondary | ICD-10-CM | POA: Diagnosis not present

## 2024-06-27 DIAGNOSIS — F319 Bipolar disorder, unspecified: Secondary | ICD-10-CM

## 2024-06-27 DIAGNOSIS — F429 Obsessive-compulsive disorder, unspecified: Secondary | ICD-10-CM

## 2024-06-27 DIAGNOSIS — F909 Attention-deficit hyperactivity disorder, unspecified type: Secondary | ICD-10-CM

## 2024-06-27 NOTE — Progress Notes (Signed)
 Office: [phone]  /  Fax: [phone]    Date: June 27, 2024  Appointment Start Time: 8:01am Duration: 28 minutes Provider: Wyatt Fire, Psy.D. Type of Session: Individual Therapy  Location of Patient: Home (private location) Location of Provider: Provider's Home (private office) Type of Contact: Telepsychological Visit via MyChart Video Visit  Session Content: Janet Palmer is a 28 y.o. adult presenting for a follow-up appointment to address the previously established treatment goal of increasing coping skills. Today's appointment was a telepsychological visit. Rylan provided verbal consent for today's telepsychological appointment and Janet Palmer is aware Janet Palmer is responsible for securing confidentiality on Janet Palmer's end of the session. Prior to proceeding with today's appointment, Devra's physical location at the time of this appointment was obtained as well a phone number Janet Palmer could be reached at in the event of technical difficulties. Tawney and this provider participated in today's telepsychological service.   This provider conducted a brief check-in. 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 She further shared she had surgery last week, noting she had complications but is healing. Janet Palmer further shared about other stressors/events. Associated thoughts and feelings processed. Regarding eating habits, she disclosed, I've been under a lot of eating-related stress. She stated she had to cease Wegovy  use due to the surgery and then she was informed that her insurance will no longer cover it. Reviewed triggers for emotional eating behaviors. She disclosed an up tick in emotional eating behaviors, and acknowledged overeating last night due to pain. Janet Palmer was engaged in problem solving to develop a plan to help cope with urges/cravings involving activities to relax (e.g., watch Dirty Dancing, watch [employer], drawing), activities to distract (e.g.,  dishes, art for commission), comforting places (e.g., mom's house, bedroom, couch, mom's backyard), people to call and connect with (e.g., Scout, mother, friend, partner), and activities that help soothe senses (e.g., pet Scout, take a shower). Janet Palmer was observed writing the plan. Overall, [name] was receptive to today's appointment as evidenced by openness to sharing, responsiveness to feedback, and willingness to implement discussed strategies .  Mental Status Examination:  Appearance: neat Behavior: appropriate to circumstances Mood: neutral Affect: mood congruent Speech: WNL Eye Contact: intermittent  Psychomotor Activity: WNL Gait: unable to assess Thought Process: linear, logical, and goal directed and no evidence or endorsement of suicidal, homicidal, and self-harm ideation, plan and intent  Thought Content/Perception: no hallucinations, delusions, bizarre thinking or behavior endorsed or observed Orientation: AAOx4 Memory/Concentration: intact Insight: fair Judgment: fair  Interventions:  Conducted a brief chart review Provided empathic reflections and validation Reviewed content from the previous session Provided positive reinforcement Employed supportive psychotherapy interventions to facilitate reduced distress and to improve coping skills with identified stressors Engaged patient in problem solving  DSM-5 Diagnosis(es): F50.89 Other Specified Feeding or Eating Disorder, Emotional Eating Behaviors, F90.9 Unspecified Attention-Deficit/Hyperactivity Disorder , 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 9 Unspecified Bipolar and Related Disorder   Treatment Goal & Progress: During the initial appointment with this provider, the following treatment goal was established: increase coping skills. Janet Palmer has demonstrated progress in Janet Palmer's goal as evidenced by increased awareness of hunger patterns, increased awareness of triggers for emotional eating behaviors,  and willingness to implement discussed strategies to improve adherence to clinic goals.   Plan: The next appointment is scheduled for 07/17/2024 at 11am, which will be via MyChart Video Visit. The next session will focus on working towards the established treatment goal. Janet Palmer will continue with Janet Palmer's primary therapist  and psychiatric provider.      Wyatt Fire, PsyD

## 2024-06-28 ENCOUNTER — Ambulatory Visit (INDEPENDENT_AMBULATORY_CARE_PROVIDER_SITE_OTHER): Admitting: Nurse Practitioner

## 2024-06-28 ENCOUNTER — Encounter (INDEPENDENT_AMBULATORY_CARE_PROVIDER_SITE_OTHER): Payer: Self-pay | Admitting: Nurse Practitioner

## 2024-06-28 VITALS — BP 125/88 | HR 93 | Temp 98.4°F | Ht 64.0 in | Wt 241.0 lb

## 2024-06-28 DIAGNOSIS — E66813 Obesity, class 3: Secondary | ICD-10-CM

## 2024-06-28 DIAGNOSIS — Z6841 Body Mass Index (BMI) 40.0 and over, adult: Secondary | ICD-10-CM

## 2024-06-28 DIAGNOSIS — E282 Polycystic ovarian syndrome: Secondary | ICD-10-CM | POA: Diagnosis not present

## 2024-06-28 DIAGNOSIS — E559 Vitamin D deficiency, unspecified: Secondary | ICD-10-CM

## 2024-06-28 DIAGNOSIS — F319 Bipolar disorder, unspecified: Secondary | ICD-10-CM

## 2024-06-28 DIAGNOSIS — R7303 Prediabetes: Secondary | ICD-10-CM

## 2024-06-28 NOTE — Progress Notes (Signed)
 Office: [phone]  /  Fax: [phone]  WEIGHT SUMMARY AND BIOMETRICS  Weight Lost Since Last Visit: 3 lb  Weight Gained Since Last Visit: 0   Vitals Temp: 98.4 F (36.9 C) BP: 125/88 Pulse Rate: 93 SpO2: 99 %   Anthropometric Measurements Height: 5' 4 (1.626 m) Weight: 241 lb (109.3 kg) BMI (Calculated): 41.35 Weight at Last Visit: 244 lb Weight Lost Since Last Visit: 3 lb Weight Gained Since Last Visit: 0 Starting Weight: 245 lb Total Weight Loss (lbs): 4 lb (1.814 kg)   Body Composition  Body Fat %: 46.2 % Fat Mass (lbs): 111.6 lbs Muscle Mass (lbs): 123.4 lbs Total Body Water (lbs): 91.2 lbs Visceral Fat Rating : 11   Other Clinical Data Fasting: no Labs: no Today's Visit #: 11 Starting Date: 08/24/23    Total Weight Loss: 4 pounds Bio Impedance Data reviewed with patient: Down 3.2 pounds of muscle, Up 0.6 pounds of adipose. Water is up 1.4 pounds.  HPI  Chief Complaint: OBESITY  Janet Palmer is here to discuss Janet Palmer's progress with Janet Palmer's obesity treatment plan. Janet Palmer is on the practicing portion control and making smarter food choices, such as increasing vegetables and decreasing simple carbohydrates and states Janet Palmer is following Janet Palmer's eating plan approximately 50 % of the time. Janet Palmer states Janet Palmer is exercising 20 minutes 2-3 days per week.   Interval History:  Since last office visit Janet Palmer had her hysteroscopy, D&C, endometrial biopsy and IUD insertion on 06/23/24- she had friable tissues and needed abrasion repair from speculum exam during surgery. She is currently on doxycycline 100 mg BID x 14 days for cervicitis. She has not been following her meal plan in the past 5 days due to surgery. She does have limited funds for food. She is currently working part time at [employer]. Still is not making a decent wage. 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 She does have a supportive  family that lives close by but mom has stage 4 pancreatic cancer.  She does like chicken as a protein source. She also will use fairlife milk, peanut butter, beans.  She does plan to increase her activity as tolerated. She does deal with fibromyalgia- no relief with gabapentin or cymbalta.  She is currently on Metformin  XR 500 mg daily, had GI side effects with BID dosing.  She continues on Vyvanse  for ADHD without side effects. Her mood is fairly controlled on Abilify .  She will go long stretches without eating and then overeat. She has been working with Dr. Sharron on getting small meals multiple times a day. She has been drinking more regular soda- she does drink hint water. She is not fasting today for labwork, will come fasting for next appointment.   Pharmacotherapy for weight loss: Janet Palmer is currently taking Wegovy  0.5 mg SQ QW for medical weight loss.  She will no longer have insurance coverage for Wegovy  so only has 3 doses left of medicine. Denies side effects. She is also taking Metformin  500 mg 1 tab daily for insulin  resistance and cholecalciferol 5000 units daily for vitamin D  deficiency.    PHYSICAL EXAM:  Blood pressure 125/88, pulse 93, temperature 98.4 F (36.9 C), height 5' 4 (1.626 m), weight 241 lb (109.3 kg), SpO2 99%. Body mass index is 41.37 kg/m.  General: Well Developed, well nourished, and in no acute distress.  HEENT: Normocephalic, atraumatic; EOMI, sclerae are anicteric. Skin: Warm and dry, good  turgor Chest:  Normal excursion, shape, no gross ABN Respiratory: No conversational dyspnea; speaking in full sentences NeuroM-Sk:  Normal gross ROM * 4 extremities  Psych: A and O X 3, insight adequate, mood- full    DIAGNOSTIC DATA REVIEWED:  Last metabolic panel Lab Results  Component Value Date   GLUCOSE 93 08/24/2023   NA 138 08/24/2023   K 4.7 08/24/2023   CL 102 08/24/2023   CO2 21 08/24/2023   BUN 8 08/24/2023   CREATININE 0.52 (L) 08/24/2023   EGFR 132  08/24/2023   CALCIUM 9.3 08/24/2023   PROT 7.5 08/24/2023   ALBUMIN 3.9 (L) 08/24/2023   LABGLOB 3.6 08/24/2023   BILITOT 0.2 08/24/2023   ALKPHOS 95 08/24/2023   AST 20 08/24/2023   ALT 18 08/24/2023     Lab Results  Component Value Date   HGBA1C 6.0 (H) 08/24/2023   HGBA1C 5.6 11/29/2014   Lab Results  Component Value Date   INSULIN  29.1 (H) 08/24/2023   Lab Results  Component Value Date   TSH 2.490 08/24/2023   CBC    Component Value Date/Time   WBC 8.1 08/24/2023 0904   WBC 5.5 11/29/2014 0832   RBC 4.74 08/24/2023 0904   RBC 4.22 11/29/2014 0832   HGB 13.8 08/24/2023 0904   HCT 43.2 08/24/2023 0904   PLT 416 08/24/2023 0904   MCV 91 08/24/2023 0904   MCH 29.1 08/24/2023 0904   MCH 30.1 11/29/2014 0832   MCHC 31.9 08/24/2023 0904   MCHC 33.2 11/29/2014 0832   RDW 13.1 08/24/2023 0904    Lipid Panel     Component Value Date/Time   CHOL 188 08/24/2023 0904   TRIG 96 08/24/2023 0904   HDL 46 08/24/2023 0904   CHOLHDL 2.6 11/29/2014 0832   VLDL 9 11/29/2014 0832   LDLCALC 124 (H) 08/24/2023 0904    Nutritional Lab Results  Component Value Date   VD25OH 14.9 (L) 08/24/2023   VD25OH 21 (L) 11/29/2014   Lab Results  Component Value Date   VITAMINB12 399 08/24/2023     ASSESSMENT AND PLAN  Class 3 severe obesity with serious comorbidity and body mass index (BMI) of 40.0 to 44.9 in adult, unspecified obesity type (HCC) TREATMENT PLAN FOR OBESITY:  Recommended Dietary Goals  Zeeva is currently in the action stage of change. As such, Janet Palmer's goal is to continue weight management plan. Janet Palmer has agreed to practicing portion control and making smarter food choices, such as increasing vegetables and decreasing simple carbohydrates.  Behavioral Intervention  We discussed the following Behavioral Modification Strategies today: focus on decreasing soda intake- she has it readily available at the [employer] she works at.   Recommended Physical  Activity Goals  Britanni has been advised to work up to 150 minutes of moderate intensity aerobic activity a week and strengthening exercises 2-3 times per week for cardiovascular health, weight loss maintenance and preservation of muscle mass.   Janet Palmer has agreed to Think about enjoyable ways to increase daily physical activity and overcoming barriers to exercise, Increase physical activity in their day and reduce sedentary time (increase NEAT)., Start strengthening exercises with a goal of 2-3 sessions a week , and Start aerobic activity with a goal of 150 minutes a week at moderate intensity.    Pharmacotherapy We discussed various medication options to help Danyah with Janet Palmer's weight loss efforts and we both agreed to complete last 3 Wegovy  injections and check with PCP to see if she can take  Topamax  due to prior .  ASSOCIATED CONDITIONS ADDRESSED TODAY  Action/Plan  Prediabetes Continue practicing portion control and making smarter food choices, limit simple carbohydrates Decreasing body weight by 10-15% can improve glucose levels Continue exercise with current goal of 150 minutes of moderate to high intensity exercise/week.  Come fasting at next appointment to recheck labs  Unspecified Bipolar and Related Disorder       Continue to follow with Dr. Okey at behavioral health  Vitamin D  deficiency      Continue to supplement with Ergocalciferol  50000 units once a week , denies side effects       Will come fasting for next visit- recheck Vit D with labs  PCOS (polycystic ovarian syndrome)       Continue Metfromin XR 500 mg every day- was discussing posisible increase in dose since Wegovy  is no longer available but had too many GI side effects at higher dosage           Return in about 4 weeks (around 07/26/2024).. Janet Palmer was informed of the importance of frequent follow up visits to maximize Janet Palmer's success with intensive lifestyle modifications for Janet Palmer's multiple health  conditions.   ATTESTASTION STATEMENTS:  Reviewed by clinician on day of visit: allergies, medications, problem list, medical history, surgical history, family history, social history, and previous encounter notes.   I personally spent a total of 58 minutes in the care of the patient today including preparing to see the patient, getting/reviewing separately obtained history, performing a medically appropriate exam/evaluation, counseling and educating, documenting clinical information in the EHR, independently interpreting results, and communicating results.   Summar Mcglothlin ANP-C

## 2024-06-30 ENCOUNTER — Ambulatory Visit (HOSPITAL_BASED_OUTPATIENT_CLINIC_OR_DEPARTMENT_OTHER): Attending: Physical Medicine and Rehabilitation | Admitting: Physical Therapy

## 2024-06-30 ENCOUNTER — Encounter (HOSPITAL_BASED_OUTPATIENT_CLINIC_OR_DEPARTMENT_OTHER): Payer: Self-pay | Admitting: Physical Therapy

## 2024-06-30 ENCOUNTER — Other Ambulatory Visit: Payer: Self-pay

## 2024-06-30 DIAGNOSIS — M79604 Pain in right leg: Secondary | ICD-10-CM | POA: Diagnosis not present

## 2024-06-30 DIAGNOSIS — M79602 Pain in left arm: Secondary | ICD-10-CM | POA: Insufficient documentation

## 2024-06-30 DIAGNOSIS — M797 Fibromyalgia: Secondary | ICD-10-CM | POA: Insufficient documentation

## 2024-06-30 DIAGNOSIS — M6281 Muscle weakness (generalized): Secondary | ICD-10-CM | POA: Insufficient documentation

## 2024-06-30 DIAGNOSIS — M79601 Pain in right arm: Secondary | ICD-10-CM | POA: Diagnosis not present

## 2024-06-30 DIAGNOSIS — F4312 Post-traumatic stress disorder, chronic: Secondary | ICD-10-CM | POA: Diagnosis not present

## 2024-06-30 DIAGNOSIS — M5459 Other low back pain: Secondary | ICD-10-CM | POA: Diagnosis not present

## 2024-06-30 DIAGNOSIS — M79605 Pain in left leg: Secondary | ICD-10-CM | POA: Insufficient documentation

## 2024-06-30 NOTE — Therapy (Signed)
 OUTPATIENT PHYSICAL THERAPY LOWER EXTREMITY EVALUATION   Patient Name: Janet Palmer MRN: [id number] DOB:1996-06-14, 28 y.o., adult Today's Date: 07/01/2024  END OF SESSION:  PT End of Session - 06/30/24 1207     Visit Number 1    Number of Visits 14    Date for Recertification  08/25/24    Authorization Type [facility] [employer]    PT Start Time 1110    PT Stop Time 1203    PT Time Calculation (min) 53 min    Activity Tolerance Patient tolerated treatment well    Behavior During Therapy WFL for tasks assessed/performed          Past Medical History:  Diagnosis Date   ADHD    Allergy    Anxiety    B12 deficiency    Back pain    Bipolar 2 disorder (HCC)    Constipation    Depression    Eating disorder    Fibromyalgia    Heartburn    Joint pain    OCD (obsessive compulsive disorder)    PCOS (polycystic ovarian syndrome)    Prediabetes    PTSD (post-traumatic stress disorder)    Vitamin D  deficiency    Past Surgical History:  Procedure Laterality Date   arm fracture Left 2006   FRACTURE SURGERY     WISDOM TOOTH EXTRACTION  2017   Patient Active Problem List   Diagnosis Date Noted   Body aches 05/05/2024   Rumination syndrome of ingested food in adult 12/22/2023   Eating disorder 11/09/2023   Vitamin D  deficiency    B12 deficiency    Severe obesity (BMI >= 40) (HCC) 06/02/2023   Prediabetes 06/02/2023   Attention-deficit hyperactivity disorder, unspecified type 11/20/2022   Bipolar 2 disorder (HCC)    OCD (obsessive compulsive disorder)    Anxiety state 02/15/2014   Polycystic ovarian syndrome 02/29/2008   Chronic pain 10-22-1997     REFERRING PROVIDER: Lorilee Sven SQUIBB, MD  REFERRING DIAG:  Diagnosis  M79.7 (ICD-10-CM) - Fibromyalgia    THERAPY DIAG:  Other low back pain  Pain in right leg  Pain in left leg  Muscle weakness (generalized)  Pain in right arm  Pain in left arm  Rationale for Evaluation and Treatment:  Rehabilitation  ONSET DATE: Chronic ; PT order 04/18/24  SUBJECTIVE:   SUBJECTIVE STATEMENT: I do not ever remember a time when I didn't have pain.  Pt is limited with walking and standing.  Pt uses a cane if she is walking or standing for more than an hour.  Pt states fatigue is an issue.  Pt becomes fatigued with mobility.  Pt has pain and difficulty with stairs and has greater difficulty with ascending > descending stairs.  Pt has pain and difficulty with cleaning and standing to wash dishes. Pt stands on her feet for most of her work activities.  She does have pain and difficulty with work activities.  Pt has difficulty with getting up from crouched position.  Pt saw PM&R and was referred to PT on 04/18/24.  Pt was having abnormal uterine bleeding.  She underwent Hysteroscopy, Dilation and curettage, IUD insertion, and Repair of vaginal abrasion on 06/23/24. No lifting > 25 lbs for 2 weeks.  Pt states she has stitches.  PERTINENT HISTORY: Hysteroscopy, Dilation and curettage, IUD insertion, and Repair of vaginal abrasion on 06/23/24.  No lifting > 25 lbs for 2 weeks    Back pain, PCOS Bipolar 2 disorder, anxiety, depression, OCD, ADHD  L arm fracture  (surgery)  2006  PAIN:  Location:  lower back, hips, ankles, feet > knees, shoulders, wrists NPRS:  4/10 current, 8/10 worst, 2/10 best  PRECAUTIONS: Other: recent surgery, no lifting > 25 lbs   WEIGHT BEARING RESTRICTIONS: No  FALLS:  Has patient fallen in last 6 months? No  LIVING ENVIRONMENT: Lives with: lives alone Lives in: 1st floor apartment Stairs: 2 steps to enter with rail Has following equipment at home:  [facility]  OCCUPATION: works at a [employer]  PLOF: Independent  PATIENT GOALS: to be able to walk longer distance, stand longer duration, live her life without thinking about pain.   OBJECTIVE:  Note: Objective measures were completed at Evaluation unless otherwise noted.  DIAGNOSTIC  FINDINGS: N/A  PATIENT SURVEYS:  PSFS: THE PATIENT SPECIFIC FUNCTIONAL SCALE  Place score of 0-10 (0 = unable to perform activity and 10 = able to perform activity at the same level as before injury or problem)  Activity Date: 10/3    Standing at work 5    2. Cleaning bathroom 3    3. Cooking large meals 6    4.  Exercising at the gym 3    5.  Walking for 1 hour plus 4    Total Score 4.2      Total Score = Sum of activity scores/number of activities  Minimally Detectable Change: 3 points (for single activity); 2 points (for average score)  [facility] (nd). The Patient Specific Functional Scale . Retrieved from [email or web address]   COGNITION: Overall cognitive status: Within functional limits for tasks assessed      UPPER EXTREMITY ROM:  Active ROM Right eval Left eval  Shoulder flexion 158 with pain 153 with pain      Shoulder abduction 102 with pain 115 with pain  Hip adduction    Hip internal rotation    Hip external rotation    Knee flexion    Knee extension    Ankle dorsiflexion    Ankle plantarflexion    Ankle inversion    Ankle eversion     (Blank rows = not tested)  LOWER EXTREMITY MMT:  MMT Right eval Left eval  Hip flexion    Hip extension    Hip abduction    Hip adduction    Hip internal rotation    Hip external rotation    Knee flexion    Knee extension    Ankle dorsiflexion    Ankle plantarflexion    Ankle inversion    Ankle eversion     (Blank rows = not tested)  Strength not tested due to recent procedure.      FUNCTIONAL TESTS:  5x STS test:  15.36 sec without UE support 6 MWT:  1,151 ft without AD.  Pt ambulated the entire 6 minutes and reports increased pain in hips and feet.  Pt states she felt a little tired after walking.   GAIT: Comments: Pt ambulated without an AD and without a limp.  She reports she has a cane which she uses for longer distance.  TREATMENT:   See below for pt education  PATIENT EDUCATION:  Education details:  PT educated pt concerning the aquatic therapy process, benefits, purpose, and rationale.  POC, dx, relevant anatomy, rationale of interventions, prognosis, and exam findings.   Person educated: Patient Education method: Medical illustrator Education comprehension: verbalized understanding, returned demonstration, and needs further education  HOME EXERCISE PROGRAM: Will give at a later date.   ASSESSMENT:  CLINICAL IMPRESSION: Patient is a 28 y.o. female with a dx of fibromyalgia and a chronic hx of pain.  Pt is limited with walking and standing and uses a cane if she is walking or standing for more than an hour.  Pt has pain and difficulty with cleaning, standing to wash dishes, and performing stairs.  Pt becomes fatigued with mobility.  She also has  pain and difficulty with work activities.  It required pt increased time to perform the 5x STS test.  She also had decreased distance on the 6 MWT.  Pt underwent Hysteroscopy, Dilation and curettage, IUD insertion, and Repair of vaginal abrasion on 06/23/24.  PT limited evaluation due to her recent procedure.  Pt will need to be cleared before she can perform aquatic therapy.  Pt should benefit from skilled PT to address impairments and improve overall function.       OBJECTIVE IMPAIRMENTS: decreased activity tolerance, decreased mobility, difficulty walking, decreased strength, and pain.   ACTIVITY LIMITATIONS: standing, squatting, stairs, and locomotion level  PARTICIPATION LIMITATIONS: cleaning, shopping, community activity, and occupation  PERSONAL FACTORS: Time since onset of injury/illness/exacerbation and 3+ comorbidities: bipolar disorder, depression, anxiety, recent procedure are also affecting patient's functional outcome.    REHAB POTENTIAL: Good  CLINICAL DECISION MAKING: Evolving/moderate complexity  EVALUATION COMPLEXITY: Moderate   GOALS:   SHORT TERM GOALS: Target date:  07/28/2024  Pt will be independent and compliant with HEP for improved pain, strength, function, and tolerance to activity.  Baseline: Goal status: INITIAL  2.  Pt will reduce time in 5x STS test by at least 4 seconds for improved functional LE strength.   Baseline:  15.36 Goal status: INITIAL  3.  Pt will report at least a 25% improvement in pain and sx's overall.  Baseline:  Goal status: INITIAL  4.  Pt will report she is able to stand to wash dishes without significant pain and fatigue.  Baseline:  Goal status: INITIAL  5.  Pt will ambulate 1,251 ft on the 6 MWT for improved tolerance with ambulation and increased community distance.  Baseline: 1,151 ft Goal status: INITIAL   LONG TERM GOALS: Target date: 08/25/2024   Pt will demo at least a 2.5 point increase in PSFS for improved self perceived disability.  Baseline:  4.2 Goal status: INITIAL  2.  Pt will tolerate aquatic therapy without adverse effects for improved tolerance to activty.  Baseline:  Goal status: INITIAL  3.  Pt will report at least a 70% improvement in pain and ease with walking and standing activities.  Baseline:  Goal status: INITIAL  4.  She will be able to perform her household chores without significant pain and difficulty.  Baseline:  Goal status: INITIAL  5.  Pt will perform stairs with a reciprocal gait with good control and also report improved ease with performing stairs.   Baseline:  Goal status: INITIAL  6.  Pt will ambulate > 1,500 ft on the 6 MWT for improved tolerance with ambulation and increased community distance.  Baseline: 1,151 ft Goal status: INITIAL  PLAN:  PT FREQUENCY: 2x/week  PT DURATION: 8 weeks  PLANNED INTERVENTIONS: 97164- PT Re-evaluation, 97750- Physical Performance Testing,  97110-Therapeutic exercises, 97530- Therapeutic activity, [id number]- Neuromuscular re-education, 97535- Self Care, [id number]- Manual therapy, [phone]- Gait training, [phone]- Aquatic Therapy, [phone]- Electrical stimulation (unattended), 97035- Ultrasound, 79439 (1-2 muscles), 20561 (3+ muscles)- Dry Needling, Patient/Family education, Stair training, Taping, Joint mobilization, Spinal mobilization, Cryotherapy, and Moist heat  PLAN FOR NEXT SESSION: Will wait on aquatic therapy until cleared by MD.  Pt to be seen after she sees MD next week.  Pt to continue with land therapy.    Leigh Minerva III PT, DPT 07/01/24 8:40 AM   For all possible CPT codes, reference the Planned Interventions line above.     Check all conditions that are expected to impact treatment: {Conditions expected to impact treatment:Psychological or psychiatric disorders   If treatment provided at initial evaluation, no treatment charged due to lack of authorization.

## 2024-07-06 DIAGNOSIS — N939 Abnormal uterine and vaginal bleeding, unspecified: Secondary | ICD-10-CM | POA: Diagnosis not present

## 2024-07-17 ENCOUNTER — Telehealth (INDEPENDENT_AMBULATORY_CARE_PROVIDER_SITE_OTHER): Admitting: Psychology

## 2024-07-17 DIAGNOSIS — F909 Attention-deficit hyperactivity disorder, unspecified type: Secondary | ICD-10-CM | POA: Diagnosis not present

## 2024-07-17 DIAGNOSIS — F5089 Other specified eating disorder: Secondary | ICD-10-CM

## 2024-07-17 DIAGNOSIS — F429 Obsessive-compulsive disorder, unspecified: Secondary | ICD-10-CM | POA: Diagnosis not present

## 2024-07-17 DIAGNOSIS — F319 Bipolar disorder, unspecified: Secondary | ICD-10-CM

## 2024-07-17 NOTE — Progress Notes (Signed)
  Office: [phone]  /  Fax: [phone]    Date: July 17, 2024  Appointment Start Time: 11:00am Duration: 28 minutes Provider: Wyatt Fire, Psy.D. Type of Session: Individual Therapy  Location of Patient: Home (private location) Location of Provider: [facility] clinic at [facility] Type of Contact: Telepsychological Visit via MyChart Video Visit  Session Content: Janet Palmer is a 28 y.o. adult presenting for a follow-up appointment to address the previously established treatment goal of increasing coping skills. Today's appointment was a telepsychological visit. [name] provided verbal consent for today's telepsychological appointment and MJ is aware MJ is responsible for securing confidentiality on MJ's end of the session. Prior to proceeding with today's appointment, [name]'s physical location at the time of this appointment was obtained as well a phone number MJ could be reached at in the event of technical difficulties. [name] and this provider participated in today's telepsychological service.   This provider conducted a brief check-in. [name] shared about her commitment ceremony. Since her surgery, she also noted she continues to experience bleeding, noting she has post OP on Wednesday. Additionally, [name] expressed desire to  improve meal prepping. Further explored. Discussed reality versus expectations. Engaged [name] in problem solving. Overall, [name] was receptive to today's appointment as evidenced by openness to sharing, responsiveness to feedback, and willingness to implement discussed strategies .  Mental Status Examination:  Appearance: neat Behavior: appropriate to circumstances Mood: neutral Affect: mood congruent Speech: WNL Eye Contact: appropriate Psychomotor Activity: WNL Gait: unable to assess Thought Process: linear, logical, and goal directed and no evidence or endorsement of suicidal, homicidal, and self-harm ideation, plan and intent  Thought  Content/Perception: no hallucinations, delusions, bizarre thinking or behavior endorsed or observed Orientation: AAOx4 Memory/Concentration: intact Insight: fair Judgment: fair  Interventions:  Conducted a brief chart review Provided empathic reflections and validation Reviewed content from the previous session Provided positive reinforcement Employed supportive psychotherapy interventions to facilitate reduced distress and to improve coping skills with identified stressors Engaged patient in problem solving  DSM-5 Diagnosis(es): F50.89 Other Specified Feeding or Eating Disorder, Emotional Eating Behaviors, F90.9 Unspecified Attention-Deficit/Hyperactivity Disorder , 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 9 Unspecified Bipolar and Related Disorder   Treatment Goal & Progress: During the initial appointment with this provider, the following treatment goal was established: increase coping skills. [name] has demonstrated progress in MJ's goal as evidenced by increased awareness of hunger patterns, increased awareness of triggers for emotional eating behaviors, and willingness to implement discussed strategies to improve adherence to clinic goals.   Plan: The next appointment is scheduled for 08/07/2024 at 10am, which will be via MyChart Video Visit. The next session will focus on working towards the established treatment goal. [name] will continue with MJ's primary therapist and psychiatric provider.      Wyatt Fire, PsyD

## 2024-07-19 DIAGNOSIS — R87615 Unsatisfactory cytologic smear of cervix: Secondary | ICD-10-CM | POA: Diagnosis not present

## 2024-07-19 DIAGNOSIS — N939 Abnormal uterine and vaginal bleeding, unspecified: Secondary | ICD-10-CM | POA: Diagnosis not present

## 2024-07-19 DIAGNOSIS — E282 Polycystic ovarian syndrome: Secondary | ICD-10-CM | POA: Diagnosis not present

## 2024-07-19 DIAGNOSIS — Z975 Presence of (intrauterine) contraceptive device: Secondary | ICD-10-CM | POA: Diagnosis not present

## 2024-07-19 DIAGNOSIS — N921 Excessive and frequent menstruation with irregular cycle: Secondary | ICD-10-CM | POA: Diagnosis not present

## 2024-07-24 DIAGNOSIS — Z636 Dependent relative needing care at home: Secondary | ICD-10-CM | POA: Diagnosis not present

## 2024-07-24 DIAGNOSIS — Z561 Change of job: Secondary | ICD-10-CM | POA: Diagnosis not present

## 2024-07-24 DIAGNOSIS — F4320 Adjustment disorder, unspecified: Secondary | ICD-10-CM | POA: Diagnosis not present

## 2024-07-26 ENCOUNTER — Ambulatory Visit (INDEPENDENT_AMBULATORY_CARE_PROVIDER_SITE_OTHER): Admitting: Family Medicine

## 2024-07-27 ENCOUNTER — Ambulatory Visit (INDEPENDENT_AMBULATORY_CARE_PROVIDER_SITE_OTHER): Admitting: Family Medicine

## 2024-07-31 DIAGNOSIS — F329 Major depressive disorder, single episode, unspecified: Secondary | ICD-10-CM | POA: Diagnosis not present

## 2024-07-31 DIAGNOSIS — F419 Anxiety disorder, unspecified: Secondary | ICD-10-CM | POA: Diagnosis not present

## 2024-07-31 DIAGNOSIS — F909 Attention-deficit hyperactivity disorder, unspecified type: Secondary | ICD-10-CM | POA: Diagnosis not present

## 2024-08-01 ENCOUNTER — Ambulatory Visit (INDEPENDENT_AMBULATORY_CARE_PROVIDER_SITE_OTHER): Admitting: Family Medicine

## 2024-08-01 VITALS — BP 126/85 | HR 92 | Temp 98.1°F | Ht 64.0 in | Wt 241.0 lb

## 2024-08-01 DIAGNOSIS — R7303 Prediabetes: Secondary | ICD-10-CM

## 2024-08-01 DIAGNOSIS — Z6841 Body Mass Index (BMI) 40.0 and over, adult: Secondary | ICD-10-CM

## 2024-08-01 MED ORDER — METFORMIN HCL ER 500 MG PO TB24: 1000.0000 mg | ORAL_TABLET | Freq: Every day | ORAL | 0 refills | Status: AC

## 2024-08-01 NOTE — Progress Notes (Signed)
 SUBJECTIVE:  Chief Complaint: Obesity  Interim History: Patient is going to [facility] in [place] for her [place] in the next few weeks.  Since early September she got a second job- one of them is at [employer] and then the other is at [employer] in [place].   She voices she is going to get moved to a more consistent schedule and that will help get her into a routine with expectations.  Intake wise she is trying to cook at home more and she is not used to doing this.  She is trying to be more mindful of cooking at home to save money.  She is really trying overall. No significant hunger or binge type compulsions. A routine she has noticed she fell into is not eating breakfast if there isn't something she has prepared ahead of time.  She is anticipating working on [facility] Friday but isn't sure what she will do for Thanksgiving.   Lindzie is here to discuss MJ's progress with MJ's obesity treatment plan. MJ is on the practicing portion control and making smarter food choices, such as increasing vegetables and decreasing simple carbohydrates and states MJ is following MJ's eating plan approximately 50-60 % of the time. MJ states MJ is not exercising, but is working 2 physical jobs.   OBJECTIVE: Visit Diagnoses: Problem List Items Addressed This Visit       Other   Severe obesity (BMI >= 40) (HCC)   Relevant Medications   metFORMIN  (GLUCOPHAGE -XR) 500 MG 24 hr tablet   Prediabetes - Primary   Relevant Medications   metFORMIN  (GLUCOPHAGE -XR) 500 MG 24 hr tablet   Other Visit Diagnoses       BMI 40.0-44.9, adult (HCC)       Relevant Medications   metFORMIN  (GLUCOPHAGE -XR) 500 MG 24 hr tablet       Vitals Temp: 98.1 F (36.7 C) BP: 126/85 Pulse Rate: 92 SpO2: 98 %   Anthropometric Measurements Height: 5' 4 (1.626 m) Weight: 241 lb (109.3 kg) BMI (Calculated): 41.35 Weight at Last Visit: 241 lb Weight Lost Since Last Visit: 0 Weight Gained Since Last Visit:  0 Starting Weight: 245 lb Total Weight Loss (lbs): 4 lb (1.814 kg)   Body Composition  Body Fat %: 44.8 % Fat Mass (lbs): 108 lbs Muscle Mass (lbs): 126.2 lbs Total Body Water (lbs): 88.6 lbs Visceral Fat Rating : 11   Other Clinical Data Fasting: no Labs: no Today's Visit #: 12 Starting Date: 08/24/23 Comments: [place]     ASSESSMENT AND PLAN: Assessment & Plan Prediabetes Patient previously had tried GLP-1 pharmacotherapy but then due to Medicaid prescription cut could no longer afford it.  Will increase metformin  to 1000 mg daily and reevaluate response at next lab draw in at next office appointment when patient discusses dietary changes.  Continue to monitor and limit simple carbohydrates especially during the holiday season. Severe obesity (BMI >= 40) (HCC)  BMI 40.0-44.9, adult (HCC)    Diet: Cassadee is currently in the action stage of change. As such, MJ's goal is to continue with weight loss efforts and has agreed to practicing portion control and making smarter food choices, such as increasing vegetables and decreasing simple carbohydrates.   Exercise:  All adults should avoid inactivity. Some activity is better than none, and adults who participate in any amount of physical activity, gain some health benefits.  Behavior Modification:  We discussed the following Behavioral Modification Strategies today: increasing lean protein intake, decreasing simple  carbohydrates, no skipping meals, better snacking choices, and holiday eating strategies.   Return in about 6 weeks (around 09/12/2024) for fasting labs.   MJ was informed of the importance of frequent follow up visits to maximize MJ's success with intensive lifestyle modifications for MJ's multiple health conditions.  Attestation Statements:   Reviewed by clinician on day of visit: allergies, medications, problem list, medical history, surgical history, family history, social history, and previous encounter  notes.     Adelita Cho, MD

## 2024-08-05 ENCOUNTER — Encounter: Payer: Self-pay | Admitting: Family Medicine

## 2024-08-07 ENCOUNTER — Telehealth (INDEPENDENT_AMBULATORY_CARE_PROVIDER_SITE_OTHER): Payer: Self-pay | Admitting: Psychology

## 2024-08-07 DIAGNOSIS — Z636 Dependent relative needing care at home: Secondary | ICD-10-CM | POA: Diagnosis not present

## 2024-08-07 DIAGNOSIS — F4320 Adjustment disorder, unspecified: Secondary | ICD-10-CM | POA: Diagnosis not present

## 2024-08-07 DIAGNOSIS — Z561 Change of job: Secondary | ICD-10-CM | POA: Diagnosis not present

## 2024-08-07 NOTE — Assessment & Plan Note (Signed)
 Patient previously had tried GLP-1 pharmacotherapy but then due to [facility] prescription cut could no longer afford it.  Will increase metformin  to 1000 mg daily and reevaluate response at next lab draw in at next office appointment when patient discusses dietary changes.  Continue to monitor and limit simple carbohydrates especially during the holiday season.

## 2024-08-12 ENCOUNTER — Other Ambulatory Visit: Payer: Self-pay

## 2024-08-12 ENCOUNTER — Emergency Department (HOSPITAL_COMMUNITY)

## 2024-08-12 ENCOUNTER — Encounter (HOSPITAL_COMMUNITY): Payer: Self-pay | Admitting: Emergency Medicine

## 2024-08-12 ENCOUNTER — Emergency Department (HOSPITAL_COMMUNITY)
Admission: EM | Admit: 2024-08-12 | Discharge: 2024-08-12 | Disposition: A | Discharge status: Home / Self Care | Attending: Emergency Medicine | Admitting: Emergency Medicine

## 2024-08-12 DIAGNOSIS — R000 Tachycardia, unspecified: Secondary | ICD-10-CM | POA: Diagnosis not present

## 2024-08-12 DIAGNOSIS — Y92410 Unspecified street and highway as the place of occurrence of the external cause: Secondary | ICD-10-CM | POA: Insufficient documentation

## 2024-08-12 DIAGNOSIS — I10 Essential (primary) hypertension: Secondary | ICD-10-CM | POA: Diagnosis not present

## 2024-08-12 DIAGNOSIS — D72829 Elevated white blood cell count, unspecified: Secondary | ICD-10-CM | POA: Diagnosis not present

## 2024-08-12 DIAGNOSIS — R0789 Other chest pain: Secondary | ICD-10-CM | POA: Diagnosis not present

## 2024-08-12 DIAGNOSIS — R079 Chest pain, unspecified: Secondary | ICD-10-CM | POA: Diagnosis not present

## 2024-08-12 DIAGNOSIS — T07XXXA Unspecified multiple injuries, initial encounter: Secondary | ICD-10-CM | POA: Diagnosis not present

## 2024-08-12 DIAGNOSIS — R109 Unspecified abdominal pain: Secondary | ICD-10-CM | POA: Diagnosis not present

## 2024-08-12 LAB — CBC WITH DIFFERENTIAL/PLATELET
Abs Immature Granulocytes: 0.07 K/uL (ref 0.00–0.07)
Basophils Absolute: 0 K/uL (ref 0.0–0.1)
Basophils Relative: 0 %
Eosinophils Absolute: 0.2 K/uL (ref 0.0–0.5)
Eosinophils Relative: 1 %
HCT: 41.9 % (ref 36.0–46.0)
Hemoglobin: 13.5 g/dL (ref 12.0–15.0)
Immature Granulocytes: 1 %
Lymphocytes Relative: 22 %
Lymphs Abs: 2.8 K/uL (ref 0.7–4.0)
MCH: 28.8 pg (ref 26.0–34.0)
MCHC: 32.2 g/dL (ref 30.0–36.0)
MCV: 89.3 fL (ref 80.0–100.0)
Monocytes Absolute: 1.1 K/uL — ABNORMAL HIGH (ref 0.1–1.0)
Monocytes Relative: 8 %
Neutro Abs: 8.7 K/uL — ABNORMAL HIGH (ref 1.7–7.7)
Neutrophils Relative %: 68 %
Platelets: 468 K/uL — ABNORMAL HIGH (ref 150–400)
RBC: 4.69 MIL/uL (ref 3.87–5.11)
RDW: 13.4 % (ref 11.5–15.5)
WBC: 12.8 K/uL — ABNORMAL HIGH (ref 4.0–10.5)
nRBC: 0 % (ref 0.0–0.2)

## 2024-08-12 LAB — COMPREHENSIVE METABOLIC PANEL WITH GFR
ALT: 8 U/L (ref 0–44)
AST: 21 U/L (ref 15–41)
Albumin: 4.1 g/dL (ref 3.5–5.0)
Alkaline Phosphatase: 89 U/L (ref 38–126)
Anion gap: 10 (ref 5–15)
BUN: 12 mg/dL (ref 6–20)
CO2: 23 mmol/L (ref 22–32)
Calcium: 9.5 mg/dL (ref 8.9–10.3)
Chloride: 106 mmol/L (ref 98–111)
Creatinine, Ser: 0.55 mg/dL (ref 0.44–1.00)
GFR, Estimated: >60 mL/min (ref >60)
Glucose, Bld: 105 mg/dL — ABNORMAL HIGH (ref 70–99)
Potassium: 4.4 mmol/L (ref 3.5–5.1)
Sodium: 139 mmol/L (ref 135–145)
Total Bilirubin: 0.3 mg/dL (ref 0.0–1.2)
Total Protein: 7.9 g/dL (ref 6.5–8.1)

## 2024-08-12 LAB — I-STAT CHEM 8, ED
BUN: 13 mg/dL (ref 6–20)
Calcium, Ion: 1.25 mmol/L (ref 1.15–1.40)
Chloride: 105 mmol/L (ref 98–111)
Creatinine, Ser: 0.7 mg/dL (ref 0.44–1.00)
Glucose, Bld: 95 mg/dL (ref 70–99)
HCT: 42 % (ref 36.0–46.0)
Hemoglobin: 14.3 g/dL (ref 12.0–15.0)
Potassium: 4.2 mmol/L (ref 3.5–5.1)
Sodium: 140 mmol/L (ref 135–145)
TCO2: 23 mmol/L (ref 22–32)

## 2024-08-12 LAB — HCG, SERUM, QUALITATIVE: Preg, Serum: NEGATIVE

## 2024-08-12 LAB — POC URINE PREG, ED: Preg Test, Ur: NEGATIVE

## 2024-08-12 MED ORDER — ALPRAZOLAM 0.5 MG PO TABS
1.0000 mg | ORAL_TABLET | Freq: Once | ORAL | Status: AC
  Administered 2024-08-12: 1 mg via ORAL
  Filled 2024-08-12: qty 2

## 2024-08-12 MED ORDER — ACETAMINOPHEN 325 MG PO TABS
975.0000 mg | ORAL_TABLET | Freq: Once | ORAL | Status: AC
  Administered 2024-08-12: 975 mg via ORAL
  Filled 2024-08-12: qty 3

## 2024-08-12 MED ORDER — METHOCARBAMOL 500 MG PO TABS
500.0000 mg | ORAL_TABLET | Freq: Two times a day (BID) | ORAL | 0 refills | Status: AC
Start: 2024-08-12 — End: ?

## 2024-08-12 MED ORDER — IOHEXOL 300 MG/ML  SOLN
100.0000 mL | Freq: Once | INTRAMUSCULAR | Status: AC | PRN
  Administered 2024-08-12: 100 mL via INTRAVENOUS
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
## 2024-08-12 NOTE — Discharge Instructions (Signed)
 Thank you for letting us  evaluate you today.  Your CT imaging did not show any traumatic injury to organs in the chest or abdomen.  Your electrolytes are within normal limits.  You do have a mildly elevated white count.  As discussed, this can be due to stress, injury, pain and is nonspecific more severely elevated.  I provided you with Tylenol here Emergency Department for pain.  Also sent you home with a prescription for Robaxin which is a muscle relaxer. Robaxin may cause drowsiness so do not operate heavy machinery including driving or drink alcohol with this.  You may take this at night or split the tablet in half if it makes you too drowsy.  Return to Emergency Department if you experience worsening chest pain despite pain medicine, shortness of breath, worsening symptoms
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
## 2024-08-15 ENCOUNTER — Encounter (INDEPENDENT_AMBULATORY_CARE_PROVIDER_SITE_OTHER): Admitting: Psychology

## 2024-08-15 ENCOUNTER — Telehealth (INDEPENDENT_AMBULATORY_CARE_PROVIDER_SITE_OTHER): Payer: Self-pay | Admitting: Psychology

## 2024-08-15 ENCOUNTER — Encounter (INDEPENDENT_AMBULATORY_CARE_PROVIDER_SITE_OTHER): Payer: Self-pay

## 2024-08-15 DIAGNOSIS — F4320 Adjustment disorder, unspecified: Secondary | ICD-10-CM | POA: Diagnosis not present

## 2024-08-15 DIAGNOSIS — Z636 Dependent relative needing care at home: Secondary | ICD-10-CM | POA: Diagnosis not present

## 2024-08-15 DIAGNOSIS — Z561 Change of job: Secondary | ICD-10-CM | POA: Diagnosis not present
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
## 2024-08-15 NOTE — Progress Notes (Signed)
 Entered in error. - Dr. Sharron

## 2024-08-22 DIAGNOSIS — Z561 Change of job: Secondary | ICD-10-CM | POA: Diagnosis not present

## 2024-08-22 DIAGNOSIS — Z636 Dependent relative needing care at home: Secondary | ICD-10-CM | POA: Diagnosis not present

## 2024-08-22 DIAGNOSIS — F4320 Adjustment disorder, unspecified: Secondary | ICD-10-CM | POA: Diagnosis not present

## 2024-08-28 ENCOUNTER — Telehealth (INDEPENDENT_AMBULATORY_CARE_PROVIDER_SITE_OTHER): Admitting: Psychology

## 2024-08-28 DIAGNOSIS — F909 Attention-deficit hyperactivity disorder, unspecified type: Secondary | ICD-10-CM

## 2024-08-28 DIAGNOSIS — F5089 Other specified eating disorder: Secondary | ICD-10-CM | POA: Diagnosis not present

## 2024-08-28 DIAGNOSIS — F319 Bipolar disorder, unspecified: Secondary | ICD-10-CM | POA: Diagnosis not present

## 2024-08-28 DIAGNOSIS — F429 Obsessive-compulsive disorder, unspecified: Secondary | ICD-10-CM
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
## 2024-08-29 DIAGNOSIS — Z561 Change of job: Secondary | ICD-10-CM | POA: Diagnosis not present

## 2024-08-29 DIAGNOSIS — Z636 Dependent relative needing care at home: Secondary | ICD-10-CM | POA: Diagnosis not present

## 2024-08-29 DIAGNOSIS — F4320 Adjustment disorder, unspecified: Secondary | ICD-10-CM | POA: Diagnosis not present

## 2024-09-04 ENCOUNTER — Encounter (INDEPENDENT_AMBULATORY_CARE_PROVIDER_SITE_OTHER): Payer: Self-pay

## 2024-09-04 ENCOUNTER — Encounter (INDEPENDENT_AMBULATORY_CARE_PROVIDER_SITE_OTHER): Admitting: Psychology

## 2024-09-04 ENCOUNTER — Telehealth (INDEPENDENT_AMBULATORY_CARE_PROVIDER_SITE_OTHER): Payer: Self-pay | Admitting: Psychology

## 2024-09-04 NOTE — Telephone Encounter (Signed)
  Office: [phone]  /  Fax: [phone]  Date of Encounter: September 04, 2024  Time of Encounter: 9:07am Duration of Encounter: 10 minute(s) Provider: Wyatt Fire, PsyD  CONTENT: Janet Palmer presented on time for today's appointment. Notably, this provider presented late due to technical difficulties. This provider conducted a brief check-in. Janet Palmer shared she obtained a rental car. Notably, she stated her current work schedule is posing challenges for her mental health, as she does not currently have two consecutive days off. Further explored and processed. She noted a desire to have space and time to re-set [her] mind to prepare herself for the work day and explained she will have to quickly eat breakfast and leave after this appointment to make it to work on time. Thus, she was engaged in problem solving to assist with the re-set. Janet Palmer was receptive to ending today's appointment early as it would help with the re-set and prepare herself for work. A brief risk assessment was completed. Janet Palmer denied experiencing suicidal, self-harm and homicidal ideation, plan, or intent. All questions/concerns addressed.   PLAN: Janet Palmer is scheduled for an appointment on 09/11/2024 at 8am, which will be the last appointment with this provider. Janet Palmer is aware. She will continue meeting with her primary therapist every Tuesday.

## 2024-09-04 NOTE — Progress Notes (Signed)
 Entered in error

## 2024-09-05 DIAGNOSIS — Z636 Dependent relative needing care at home: Secondary | ICD-10-CM | POA: Diagnosis not present

## 2024-09-05 DIAGNOSIS — Z561 Change of job: Secondary | ICD-10-CM | POA: Diagnosis not present

## 2024-09-05 DIAGNOSIS — F4320 Adjustment disorder, unspecified: Secondary | ICD-10-CM | POA: Diagnosis not present

## 2024-09-11 ENCOUNTER — Telehealth (INDEPENDENT_AMBULATORY_CARE_PROVIDER_SITE_OTHER): Admitting: Psychology

## 2024-09-11 DIAGNOSIS — F319 Bipolar disorder, unspecified: Secondary | ICD-10-CM | POA: Diagnosis not present

## 2024-09-11 DIAGNOSIS — F429 Obsessive-compulsive disorder, unspecified: Secondary | ICD-10-CM | POA: Diagnosis not present

## 2024-09-11 DIAGNOSIS — F909 Attention-deficit hyperactivity disorder, unspecified type: Secondary | ICD-10-CM | POA: Diagnosis not present

## 2024-09-11 DIAGNOSIS — F5089 Other specified eating disorder: Secondary | ICD-10-CM | POA: Diagnosis not present
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
## 2024-09-12 DIAGNOSIS — Z636 Dependent relative needing care at home: Secondary | ICD-10-CM | POA: Diagnosis not present

## 2024-09-12 DIAGNOSIS — F4320 Adjustment disorder, unspecified: Secondary | ICD-10-CM | POA: Diagnosis not present

## 2024-09-12 DIAGNOSIS — Z561 Change of job: Secondary | ICD-10-CM | POA: Diagnosis not present

## 2024-09-13 ENCOUNTER — Ambulatory Visit (INDEPENDENT_AMBULATORY_CARE_PROVIDER_SITE_OTHER): Admitting: Family Medicine

## 2024-09-13 VITALS — BP 128/80 | HR 82 | Temp 98.0°F | Ht 64.0 in | Wt 248.0 lb

## 2024-09-13 DIAGNOSIS — R7303 Prediabetes: Secondary | ICD-10-CM | POA: Diagnosis not present

## 2024-09-13 DIAGNOSIS — E559 Vitamin D deficiency, unspecified: Secondary | ICD-10-CM | POA: Diagnosis not present

## 2024-09-13 DIAGNOSIS — E7800 Pure hypercholesterolemia, unspecified: Secondary | ICD-10-CM | POA: Diagnosis not present

## 2024-09-13 DIAGNOSIS — Z6841 Body Mass Index (BMI) 40.0 and over, adult: Secondary | ICD-10-CM

## 2024-09-13 DIAGNOSIS — E66813 Obesity, class 3: Secondary | ICD-10-CM | POA: Diagnosis not present

## 2024-09-13 NOTE — Progress Notes (Signed)
 "  SUBJECTIVE:  Chief Complaint: Obesity  Interim History: Patient here for follow up prior to Thanksgiving.  Patient had an alright Thanksgiving and went to two separate Thanksgivings.  Intake wise patient does not feel capable in changing how she eats unless she is being physically forced to.  She is having an extended celebration on December 20th then celebrating on Christmas day with her dad and step mom.  Patient has not had any medication changes since our last appointment.  Still working her two jobs and overall is doing relatively well.   Amia is here to discuss MJ's progress with MJ's obesity treatment plan. MJ is on the Category 3 Plan and states MJ is following MJ's eating plan approximately 30 % of the time. MJ states MJ is walking around at work.   OBJECTIVE: Visit Diagnoses: Problem List Items Addressed This Visit       Other   Prediabetes   Relevant Orders   Comprehensive metabolic panel with GFR   Hemoglobin A1c   Insulin , random   Vitamin D  deficiency   Relevant Orders   VITAMIN D  25 Hydroxy (Vit-D Deficiency, Fractures)   Other Visit Diagnoses       Class 3 severe obesity with serious comorbidity and body mass index (BMI) of 40.0 to 44.9 in adult, unspecified obesity type (HCC)    -  Primary     Morbid obesity (HCC)         Pure hypercholesterolemia       Relevant Orders   Lipid Panel With LDL/HDL Ratio       Vitals Temp: 98 F (36.7 C) BP: 128/80 Pulse Rate: 82 SpO2: 97 %   Anthropometric Measurements Height: 5' 4 (1.626 m) Weight: 248 lb (112.5 kg) BMI (Calculated): 42.55 Weight at Last Visit: 241 lb Weight Lost Since Last Visit: 0 Weight Gained Since Last Visit: 7 Starting Weight: 245 lb Total Weight Loss (lbs): 0 lb (0 kg)   Body Composition  Body Fat %: 44.9 % Fat Mass (lbs): 111.4 lbs Muscle Mass (lbs): 129.8 lbs Total Body Water (lbs): 88.6 lbs Visceral Fat Rating : 11   Other Clinical Data Fasting: yes Labs: yes Today's  Visit #: 13 Starting Date: 08/24/23 Comments: Cat 3     ASSESSMENT AND PLAN: Assessment & Plan Prediabetes Patient has been working on dietary changes that limit simple carbohydrate intake.  Will repeat hemoglobin A1c, insulin , and CMP today and plan to discuss labs with patient at next appointment.  Patient may need to take time away from clinic given significant challenges of implementing consistent lifestyle change if any concerns noted on labs will call patient prior to her next appointment. Vitamin D  deficiency Has been on prescription strength vitamin D  and needs a repeat evaluation of vitamin D  supplementation response.  Vitamin D  level ordered today. Pure hypercholesterolemia Previously elevated LDL and needs a repeat fasting lipid panel to assess response of lifestyle changes on cholesterol levels.  Fasting lipid panel ordered today. Class 3 severe obesity with serious comorbidity and body mass index (BMI) of 40.0 to 44.9 in adult, unspecified obesity type (HCC)  Morbid obesity (HCC)    Diet: Mahogani is currently in the action stage of change. As such, MJ's goal is to continue with weight loss efforts and has agreed to practicing portion control and making smarter food choices, such as increasing vegetables and decreasing simple carbohydrates.   Exercise:  All adults should avoid inactivity. Some activity is better than none, and adults who  participate in any amount of physical activity, gain some health benefits.  Behavior Modification:  We discussed the following Behavioral Modification Strategies today: increasing lean protein intake, decreasing simple carbohydrates, meal planning and cooking strategies, keeping healthy foods in the home, and holiday eating strategies.  Follow-up in 3 to 4 months MJ was informed of the importance of frequent follow up visits to maximize MJ's success with intensive lifestyle modifications for MJ's multiple health conditions.  Attestation  Statements:   Reviewed by clinician on day of visit: allergies, medications, problem list, medical history, surgical history, family history, social history, and previous encounter notes.    Adelita Cho, MD "

## 2024-09-14 LAB — COMPREHENSIVE METABOLIC PANEL WITH GFR
ALT: 11 IU/L (ref 0–32)
AST: 16 IU/L (ref 0–40)
Albumin: 3.8 g/dL — ABNORMAL LOW (ref 4.0–5.0)
Alkaline Phosphatase: 81 IU/L (ref 41–116)
BUN/Creatinine Ratio: 15 (ref 9–23)
BUN: 8 mg/dL (ref 6–20)
Bilirubin Total: 0.2 mg/dL (ref 0.0–1.2)
CO2: 21 mmol/L (ref 20–29)
Calcium: 8.8 mg/dL (ref 8.7–10.2)
Chloride: 103 mmol/L (ref 96–106)
Creatinine, Ser: 0.54 mg/dL — ABNORMAL LOW (ref 0.57–1.00)
Globulin, Total: 3 g/dL (ref 1.5–4.5)
Glucose: 94 mg/dL (ref 70–99)
Potassium: 4.5 mmol/L (ref 3.5–5.2)
Sodium: 139 mmol/L (ref 134–144)
Total Protein: 6.8 g/dL (ref 6.0–8.5)
eGFR: 130 mL/min/1.73 (ref >59)

## 2024-09-14 LAB — LIPID PANEL WITH LDL/HDL RATIO
Cholesterol, Total: 153 mg/dL (ref 100–199)
HDL: 36 mg/dL — ABNORMAL LOW (ref >39)
LDL Chol Calc (NIH): 88 mg/dL (ref 0–99)
LDL/HDL Ratio: 2.4 ratio (ref 0.0–3.2)
Triglycerides: 168 mg/dL — ABNORMAL HIGH (ref 0–149)
VLDL Cholesterol Cal: 29 mg/dL (ref 5–40)

## 2024-09-14 LAB — HEMOGLOBIN A1C
Est. average glucose Bld gHb Est-mCnc: 123 mg/dL
Hgb A1c MFr Bld: 5.9 % — ABNORMAL HIGH (ref 4.8–5.6)

## 2024-09-14 LAB — VITAMIN D 25 HYDROXY (VIT D DEFICIENCY, FRACTURES): Vit D, 25-Hydroxy: 18.4 ng/mL — ABNORMAL LOW (ref 30.0–100.0)

## 2024-09-14 LAB — INSULIN, RANDOM: INSULIN: 39.8 u[IU]/mL — ABNORMAL HIGH (ref 2.6–24.9)

## 2024-09-25 NOTE — Assessment & Plan Note (Signed)
 Patient has been working on dietary changes that limit simple carbohydrate intake.  Will repeat hemoglobin A1c, insulin , and CMP today and plan to discuss labs with patient at next appointment.  Patient may need to take time away from clinic given significant challenges of implementing consistent lifestyle change if any concerns noted on labs will call patient prior to her next appointment.

## 2024-09-25 NOTE — Assessment & Plan Note (Signed)
 Has been on prescription strength vitamin D  and needs a repeat evaluation of vitamin D  supplementation response.  Vitamin D  level ordered today.

## 2024-09-26 DIAGNOSIS — Z561 Change of job: Secondary | ICD-10-CM | POA: Diagnosis not present

## 2024-09-26 DIAGNOSIS — Z636 Dependent relative needing care at home: Secondary | ICD-10-CM | POA: Diagnosis not present

## 2025-02-07 ENCOUNTER — Ambulatory Visit (INDEPENDENT_AMBULATORY_CARE_PROVIDER_SITE_OTHER): Admitting: Family Medicine
